# Patient Record
Sex: Female | Born: 1985 | Race: Black or African American | Hispanic: No | Marital: Married | Smoking: Never smoker
Health system: Southern US, Community
[De-identification: ages and names within clinical notes are randomized; demographics above are authoritative.]

## PROBLEM LIST (undated history)

## (undated) ENCOUNTER — Inpatient Hospital Stay (HOSPITAL_COMMUNITY): Payer: Self-pay

## (undated) DIAGNOSIS — Z789 Other specified health status: Secondary | ICD-10-CM

## (undated) DIAGNOSIS — O26892 Other specified pregnancy related conditions, second trimester: Secondary | ICD-10-CM

## (undated) DIAGNOSIS — N898 Other specified noninflammatory disorders of vagina: Secondary | ICD-10-CM

## (undated) DIAGNOSIS — O1205 Gestational edema, complicating the puerperium: Secondary | ICD-10-CM

## (undated) DIAGNOSIS — Z8759 Personal history of other complications of pregnancy, childbirth and the puerperium: Secondary | ICD-10-CM

## (undated) HISTORY — DX: Personal history of other complications of pregnancy, childbirth and the puerperium: Z87.59

## (undated) HISTORY — PX: NO PAST SURGERIES: SHX2092

## (undated) HISTORY — PX: WISDOM TOOTH EXTRACTION: SHX21

---

## 2007-03-31 ENCOUNTER — Inpatient Hospital Stay (HOSPITAL_COMMUNITY): Admission: AD | Admit: 2007-03-31 | Discharge: 2007-03-31 | Payer: Self-pay | Admitting: Gynecology

## 2007-06-16 ENCOUNTER — Other Ambulatory Visit: Payer: Self-pay | Admitting: Obstetrics and Gynecology

## 2007-06-17 ENCOUNTER — Emergency Department (HOSPITAL_COMMUNITY): Admission: EM | Admit: 2007-06-17 | Discharge: 2007-06-17 | Payer: Self-pay | Admitting: Emergency Medicine

## 2007-06-29 ENCOUNTER — Inpatient Hospital Stay (HOSPITAL_COMMUNITY): Admission: AD | Admit: 2007-06-29 | Discharge: 2007-06-29 | Payer: Self-pay | Admitting: Obstetrics & Gynecology

## 2007-10-13 ENCOUNTER — Inpatient Hospital Stay (HOSPITAL_COMMUNITY): Admission: AD | Admit: 2007-10-13 | Discharge: 2007-10-13 | Payer: Self-pay | Admitting: Obstetrics and Gynecology

## 2007-10-14 ENCOUNTER — Inpatient Hospital Stay (HOSPITAL_COMMUNITY): Admission: AD | Admit: 2007-10-14 | Discharge: 2007-10-14 | Payer: Self-pay | Admitting: Obstetrics and Gynecology

## 2007-10-18 ENCOUNTER — Inpatient Hospital Stay (HOSPITAL_COMMUNITY): Admission: AD | Admit: 2007-10-18 | Discharge: 2007-10-18 | Payer: Self-pay | Admitting: Obstetrics and Gynecology

## 2007-11-28 ENCOUNTER — Inpatient Hospital Stay (HOSPITAL_COMMUNITY): Admission: AD | Admit: 2007-11-28 | Discharge: 2007-11-30 | Payer: Self-pay | Admitting: Obstetrics & Gynecology

## 2009-07-28 ENCOUNTER — Emergency Department (HOSPITAL_COMMUNITY): Admission: EM | Admit: 2009-07-28 | Discharge: 2009-07-28 | Payer: Self-pay | Admitting: Emergency Medicine

## 2009-09-21 ENCOUNTER — Encounter: Admission: RE | Admit: 2009-09-21 | Discharge: 2009-11-22 | Payer: Self-pay | Admitting: Family Medicine

## 2010-08-06 LAB — URINALYSIS, ROUTINE W REFLEX MICROSCOPIC
Bilirubin Urine: NEGATIVE
Glucose, UA: NEGATIVE mg/dL
Hgb urine dipstick: NEGATIVE
Ketones, ur: NEGATIVE mg/dL
pH: 6.5 (ref 5.0–8.0)

## 2010-08-06 LAB — POCT I-STAT, CHEM 8
BUN: 13 mg/dL (ref 6–23)
Calcium, Ion: 1.11 mmol/L — ABNORMAL LOW (ref 1.12–1.32)
Creatinine, Ser: 0.8 mg/dL (ref 0.4–1.2)
HCT: 36 % (ref 36.0–46.0)

## 2010-08-06 LAB — POCT PREGNANCY, URINE: Preg Test, Ur: NEGATIVE

## 2011-02-01 LAB — COMPREHENSIVE METABOLIC PANEL
ALT: 22
AST: 26
Alkaline Phosphatase: 26 — ABNORMAL LOW
CO2: 23
Calcium: 9
Creatinine, Ser: 0.55
Glucose, Bld: 88
Total Bilirubin: 0.6

## 2011-02-01 LAB — DIFFERENTIAL
Basophils Absolute: 0
Eosinophils Absolute: 0.1
Eosinophils Absolute: 0
Eosinophils Relative: 0
Lymphocytes Relative: 26
Lymphocytes Relative: 5 — ABNORMAL LOW
Lymphs Abs: 2.1
Monocytes Absolute: 0.7
Monocytes Absolute: 0.4
Monocytes Relative: 6
Neutro Abs: 5.7
Neutrophils Relative %: 63

## 2011-02-01 LAB — URINE CULTURE

## 2011-02-01 LAB — CBC
HCT: 32.4 — ABNORMAL LOW
MCV: 83.1
Platelets: 302
RDW: 13.5
RDW: 14.4
WBC: 8.1

## 2011-02-01 LAB — CULTURE, BLOOD (ROUTINE X 2)

## 2011-02-07 LAB — FETAL FIBRONECTIN: Fetal Fibronectin: NEGATIVE

## 2011-02-07 LAB — URINALYSIS, ROUTINE W REFLEX MICROSCOPIC
Bilirubin Urine: NEGATIVE
Glucose, UA: NEGATIVE
Nitrite: NEGATIVE
Protein, ur: NEGATIVE
Urobilinogen, UA: 0.2

## 2011-02-08 LAB — CBC
HCT: 35 — ABNORMAL LOW
Hemoglobin: 10.4 — ABNORMAL LOW
Hemoglobin: 11.8 — ABNORMAL LOW
MCV: 86.7
Platelets: 191
RBC: 4.02
RDW: 13.8
WBC: 10.8 — ABNORMAL HIGH

## 2011-02-08 LAB — RPR: RPR Ser Ql: NONREACTIVE

## 2011-02-08 LAB — RH IMMUNE GLOB WKUP(>/=20WKS)(NOT WOMEN'S HOSP)

## 2011-02-19 LAB — CBC
Hemoglobin: 13.9
MCV: 82.1
Platelets: 332
RBC: 4.97
RDW: 13.1

## 2011-02-19 LAB — POCT PREGNANCY, URINE
Operator id: 266701
Preg Test, Ur: NEGATIVE

## 2011-02-19 LAB — URINALYSIS, ROUTINE W REFLEX MICROSCOPIC
Ketones, ur: 15 — AB
Leukocytes, UA: NEGATIVE
Specific Gravity, Urine: 1.02

## 2011-02-19 LAB — URINE MICROSCOPIC-ADD ON: WBC, UA: NONE SEEN

## 2011-10-03 ENCOUNTER — Ambulatory Visit: Payer: Self-pay | Admitting: Family Medicine

## 2011-11-13 ENCOUNTER — Ambulatory Visit: Payer: Self-pay | Admitting: Family

## 2012-10-14 ENCOUNTER — Encounter (HOSPITAL_BASED_OUTPATIENT_CLINIC_OR_DEPARTMENT_OTHER): Payer: Self-pay | Admitting: Emergency Medicine

## 2012-10-14 ENCOUNTER — Emergency Department (HOSPITAL_BASED_OUTPATIENT_CLINIC_OR_DEPARTMENT_OTHER)
Admission: EM | Admit: 2012-10-14 | Discharge: 2012-10-14 | Disposition: A | Discharge status: Home / Self Care | Payer: Managed Care, Other (non HMO) | Attending: Emergency Medicine | Admitting: Emergency Medicine

## 2012-10-14 DIAGNOSIS — R21 Rash and other nonspecific skin eruption: Secondary | ICD-10-CM | POA: Insufficient documentation

## 2012-10-14 DIAGNOSIS — R109 Unspecified abdominal pain: Secondary | ICD-10-CM | POA: Insufficient documentation

## 2012-10-14 DIAGNOSIS — R197 Diarrhea, unspecified: Secondary | ICD-10-CM | POA: Insufficient documentation

## 2012-10-14 DIAGNOSIS — R110 Nausea: Secondary | ICD-10-CM | POA: Insufficient documentation

## 2012-10-14 MED ORDER — ONDANSETRON 8 MG PO TBDP
8.0000 mg | ORAL_TABLET | Freq: Once | ORAL | Status: AC
  Administered 2012-10-14: 8 mg via ORAL
  Filled 2012-10-14: qty 1

## 2012-10-14 NOTE — ED Notes (Signed)
MD at bedside. 

## 2012-10-14 NOTE — ED Notes (Signed)
Pt reports diarrhea, abd pain and rash today. Pt reports similar episode x 1 week ago

## 2012-10-14 NOTE — ED Provider Notes (Signed)
History  This chart was scribed for Arhum Peeples Smitty Cords, MD by Bennett Scrape, ED Scribe. This patient was seen in room MH09/MH09 and the patient's care was started at 11:04 PM.  CSN: [id number]  Arrival date & time 10/14/12  2219   First MD Initiated Contact with Patient 10/14/12 2304      Chief Complaint  Patient presents with  . Diarrhea  . Rash     Patient is a 27 y.o. female presenting with diarrhea. The history is provided by the patient. No language interpreter was used.  Diarrhea Quality:  Semi-solid Severity:  Mild Number of episodes:  1 Timing:  Rare Progression:  Unchanged Relieved by:  Nothing Worsened by:  Nothing tried Ineffective treatments:  None tried Associated symptoms: no chills, no fever and no vomiting   Risk factors: no recent antibiotic use and no sick contacts     HPI Comments: Nasreen B Gaynell Face is a 27 y.o. female who presents to the Emergency Department complaining of one episode of diarrhea described as loose with associated cramps and diffuse rash that started earlier today. She reports that she had two similar episodes one week ago and two weeks ago. She reports that she ate steak and greens, Mindi Slicker and steak before each episode respectively.  She denies CP, SOB and fevers as associated symptoms. She reports that she just ended her LNMP. Pt does not have a h/o chronic medical conditions and is an occasional alcohol user but denies smoking.  History reviewed. No pertinent past medical history.  History reviewed. No pertinent past surgical history.  No family history on file.  History  Substance Use Topics  . Smoking status: Never Smoker   . Smokeless tobacco: Not on file  . Alcohol Use: Yes    No OB history provided.  Review of Systems  Constitutional: Negative for fever and chills.  Gastrointestinal: Positive for nausea and diarrhea. Negative for vomiting and blood in stool.  Skin: Positive for rash.  All other systems  reviewed and are negative.    Allergies  Sulfa antibiotics  Home Medications   Current Outpatient Rx  Name  Route  Sig  Dispense  Refill  . HYDROcodone-acetaminophen (NORCO/VICODIN) 5-325 MG per tablet   Oral   Take 1 tablet by mouth every 6 (six) hours as needed for pain.           Triage Vitals: BP 133/85  Pulse 70  Temp(Src) 98.4 F (36.9 C) (Oral)  Resp 18  Ht 5\' 6"  (1.676 m)  Wt 150 lb (68.04 kg)  BMI 24.22 kg/m2  SpO2 98%  Physical Exam  Nursing note and vitals reviewed. Constitutional: She is oriented to person, place, and time. She appears well-developed and well-nourished. No distress.  HENT:  Head: Normocephalic and atraumatic.  Mouth/Throat: Oropharynx is clear and moist.  Moist MM, no oral lesions  Eyes: Conjunctivae and EOM are normal. Pupils are equal, round, and reactive to light.  Normal fundi  Neck: Neck supple. No tracheal deviation present.  Cardiovascular: Normal rate, regular rhythm, normal heart sounds and intact distal pulses.   Pulmonary/Chest: Effort normal and breath sounds normal. No respiratory distress. She has no wheezes.  Abdominal: Soft. Bowel sounds are normal. She exhibits no distension and no mass. There is no tenderness. There is no rebound and no guarding.  Musculoskeletal: Normal range of motion. She exhibits no edema.  No lesions on the lower extremities   Neurological: She is alert and oriented to person, place, and time.  She displays normal reflexes.  Skin: Skin is warm and dry.  No lesions  Psychiatric: She has a normal mood and affect. Her behavior is normal.    ED Course  Procedures (including critical care time)  DIAGNOSTIC STUDIES: Oxygen Saturation is 98% on room air, normal by my interpretation.    COORDINATION OF CARE: 11:13 PM-Advised pt to start keeping a food diary to determine where the symptoms are coming from. Discussed treatment plan which includes nausea and diet instructions with pt at bedside and pt  agreed to plan. Informed pt to try pasta, bread and activa to help improve her symptoms.  Labs Reviewed - No data to display No results found.   No diagnosis found.    MDM  No rash visible no stool nor emesis in the ED exam and vitals benign, no indication for labs or imaging at this time.  Follow up with your family doctor for ongoing care    I personally performed the services described in this documentation, which was scribed in my presence. The recorded information has been reviewed and is accurate.     Jasmine Awe, MD 10/15/12 [phone]

## 2013-04-09 ENCOUNTER — Telehealth: Payer: Self-pay

## 2013-04-09 ENCOUNTER — Ambulatory Visit (INDEPENDENT_AMBULATORY_CARE_PROVIDER_SITE_OTHER): Payer: Managed Care, Other (non HMO) | Admitting: Family Medicine

## 2013-04-09 VITALS — BP 116/80 | HR 72 | Temp 98.8°F | Resp 16 | Ht 66.0 in | Wt 149.0 lb

## 2013-04-09 DIAGNOSIS — R112 Nausea with vomiting, unspecified: Secondary | ICD-10-CM

## 2013-04-09 DIAGNOSIS — L509 Urticaria, unspecified: Secondary | ICD-10-CM

## 2013-04-09 MED ORDER — RANITIDINE HCL 150 MG PO TABS: 150.0000 mg | ORAL_TABLET | Freq: Two times a day (BID) | ORAL | Status: DC

## 2013-04-09 MED ORDER — METHYLPREDNISOLONE ACETATE 80 MG/ML IJ SUSP
80.0000 mg | Freq: Once | INTRAMUSCULAR | Status: AC
  Administered 2013-04-09: 80 mg via INTRAMUSCULAR

## 2013-04-09 MED ORDER — PROMETHAZINE HCL 25 MG PO TABS: 25.0000 mg | ORAL_TABLET | Freq: Four times a day (QID) | ORAL | Status: DC | PRN

## 2013-04-09 NOTE — Progress Notes (Signed)
Subjective: 27 year old lady who has had several recent episodes of again has and vomiting. Has not been able to figure out any etiology. This morning she got up then broke out in hives vomited a couple times and came in here. She is not any breathing problems.  The has a party subsided  Objective: No rash visible. No oral lesions. Chest is clear. Heart regular without any murmurs.  Assessment: Recurrent urticaria Vomiting  Per instructions

## 2013-04-09 NOTE — Telephone Encounter (Signed)
Patient states that she is pain and wants a medication called in to help with it. [facility]   [phone]

## 2013-04-09 NOTE — Telephone Encounter (Signed)
Noted  

## 2013-04-09 NOTE — Patient Instructions (Signed)
Take Zyrtec one daily for antihistamine  Take Zantac one twice daily  ranitidine) for hives  Return if in all worse  Referral is being made to the allergist.

## 2013-04-09 NOTE — Telephone Encounter (Signed)
If she is painful, she should come back in, this is new. She is complaining of abdominal pain, have discussed with Dr Alwyn Ren, called patient. She should come back in. She states she will try to come back in now, but she is very painful. Advised her if she can not make it back in here, she should go to the emergency room, she understands/ agrees.

## 2013-08-16 ENCOUNTER — Other Ambulatory Visit: Payer: Self-pay | Admitting: Family Medicine

## 2013-08-16 ENCOUNTER — Ambulatory Visit (INDEPENDENT_AMBULATORY_CARE_PROVIDER_SITE_OTHER): Payer: Managed Care, Other (non HMO) | Admitting: Emergency Medicine

## 2013-08-16 VITALS — BP 110/60 | HR 60 | Temp 98.0°F | Resp 16 | Ht 65.75 in | Wt 143.0 lb

## 2013-08-16 DIAGNOSIS — R1013 Epigastric pain: Secondary | ICD-10-CM

## 2013-08-16 DIAGNOSIS — R110 Nausea: Secondary | ICD-10-CM

## 2013-08-16 LAB — POCT CBC
Granulocyte percent: 71.5 %G (ref 37–80)
HCT, POC: 40.7 % (ref 37.7–47.9)
HEMOGLOBIN: 12.8 g/dL (ref 12.2–16.2)
Lymph, poc: 2.1 (ref 0.6–3.4)
MCH: 26.8 pg — AB (ref 27–31.2)
MCHC: 31.4 g/dL — AB (ref 31.8–35.4)
MCV: 85.1 fL (ref 80–97)
MID (cbc): 0.5 (ref 0–0.9)
MPV: 8.9 fL (ref 0–99.8)
POC GRANULOCYTE: 6.4 (ref 2–6.9)
POC LYMPH PERCENT: 23 %L (ref 10–50)
POC MID %: 5.5 % (ref 0–12)
Platelet Count, POC: 397 10*3/uL (ref 142–424)
RBC: 4.78 M/uL (ref 4.04–5.48)
RDW, POC: 14.1 %
WBC: 9 10*3/uL (ref 4.6–10.2)

## 2013-08-16 MED ORDER — SUCRALFATE 1 G PO TABS: ORAL_TABLET | ORAL | Status: DC

## 2013-08-16 MED ORDER — LANSOPRAZOLE 30 MG PO CPDR: 30.0000 mg | DELAYED_RELEASE_CAPSULE | Freq: Every day | ORAL | Status: DC

## 2013-08-16 NOTE — Patient Instructions (Signed)

## 2013-08-16 NOTE — Progress Notes (Signed)
[facility] [address], [place] [address] [id number]  Date:  08/16/2013   Name:  Jessica Quinn   DOB:  May 14, 1985   MRN:  [id number]  PCP:  No PCP Per Patient    Chief Complaint: Abdominal Pain   History of Present Illness:  Jessica Quinn is a 28 y.o. very pleasant female patient who presents with the following:  Ill with a week of epigastric pain. Says nauseated intermittently but only vomited once.  Thinks it may be related to "eating my mom's holiday food".  No excess alcohol or NSAID or ASA.  No history of PUD, GERD or GB.  No vomiting blood or black stools or blood in stool.  Pain does not radiate.  No improvement with over the counter medications or other home remedies. Denies other complaint or health concern today.   Patient Active Problem List   Diagnosis Date Noted  . Hives 04/09/2013    No past medical history on file.  No past surgical history on file.  History  Substance Use Topics  . Smoking status: Never Smoker   . Smokeless tobacco: Not on file  . Alcohol Use: Yes    No family history on file.  Allergies  Allergen Reactions  . Sulfa Antibiotics Rash    Medication list has been reviewed and updated.  Current Outpatient Prescriptions on File Prior to Visit  Medication Sig Dispense Refill  . ranitidine (ZANTAC) 150 MG tablet Take 1 tablet (150 mg total) by mouth 2 (two) times daily.  60 tablet  0  . HYDROcodone-acetaminophen (NORCO/VICODIN) 5-325 MG per tablet Take 1 tablet by mouth every 6 (six) hours as needed for pain.      . promethazine (PHENERGAN) 25 MG tablet Take 1 tablet (25 mg total) by mouth every 6 (six) hours as needed for nausea or vomiting.  20 tablet  0   No current facility-administered medications on file prior to visit.    Review of Systems:  As per HPI, otherwise negative.    Physical Examination: Filed Vitals:   08/16/13 1257  BP: 110/60  Pulse: 60  Temp: 98 F (36.7 C)  Resp: 16    Filed Vitals:   08/16/13 1257  Height: 5' 5.75" (1.67 m)  Weight: 143 lb (64.864 kg)   Body mass index is 23.26 kg/(m^2). Ideal Body Weight: Weight in (lb) to have BMI = 25: 153.4  GEN: WDWN, NAD, Non-toxic, A & O x 3 HEENT: Atraumatic, Normocephalic. Neck supple. No masses, No LAD. Ears and Nose: No external deformity. CV: RRR, No M/G/R. No JVD. No thrill. No extra heart sounds. PULM: CTA B, no wheezes, crackles, rhonchi. No retractions. No resp. distress. No accessory muscle use. ABD: S, tender epigastrium, ND, +BS. No rebound. No HSM. EXTR: No c/c/e NEURO Normal gait.  PSYCH: Normally interactive. Conversant. Not depressed or anxious appearing.  Calm demeanor.    Assessment and Plan: Abdominal pain ?gastritis vs PUD vs gallbladder. Labs  Prevacid sono GB if labs negative NM study if sono negative Follow up in a week   Signed,  Phillips Odor, MD   Results for orders placed in visit on 08/16/13  POCT CBC      Result Value Ref Range   WBC 9.0  4.6 - 10.2 K/uL   Lymph, poc 2.1  0.6 - 3.4   POC LYMPH PERCENT 23.0  10 - 50 %L   MID (cbc) 0.5  0 - 0.9   POC  MID % 5.5  0 - 12 %M   POC Granulocyte 6.4  2 - 6.9   Granulocyte percent 71.5  37 - 80 %G   RBC 4.78  4.04 - 5.48 M/uL   Hemoglobin 12.8  12.2 - 16.2 g/dL   HCT, POC 30.840.7  65.737.7 - 47.9 %   MCV 85.1  80 - 97 fL   MCH, POC 26.8 (*) 27 - 31.2 pg   MCHC 31.4 (*) 31.8 - 35.4 g/dL   RDW, POC 84.614.1     Platelet Count, POC 397  142 - 424 K/uL   MPV 8.9  0 - 99.8 fL

## 2013-08-17 LAB — COMPREHENSIVE METABOLIC PANEL
ALT: 9 U/L (ref 0–35)
AST: 17 U/L (ref 0–37)
Albumin: 4.8 g/dL (ref 3.5–5.2)
Alkaline Phosphatase: 38 U/L — ABNORMAL LOW (ref 39–117)
BUN: 12 mg/dL (ref 6–23)
CALCIUM: 9.6 mg/dL (ref 8.4–10.5)
CHLORIDE: 100 meq/L (ref 96–112)
CO2: 24 mEq/L (ref 19–32)
Creat: 0.71 mg/dL (ref 0.50–1.10)
Glucose, Bld: 74 mg/dL (ref 70–99)
POTASSIUM: 4.2 meq/L (ref 3.5–5.3)
Sodium: 136 mEq/L (ref 135–145)
TOTAL PROTEIN: 8 g/dL (ref 6.0–8.3)
Total Bilirubin: 0.7 mg/dL (ref 0.2–1.2)

## 2013-08-17 LAB — LIPASE: LIPASE: 16 U/L (ref 0–75)

## 2013-08-17 LAB — AMYLASE: Amylase: 48 U/L (ref 0–105)

## 2013-08-17 LAB — H. PYLORI ANTIBODY, IGG: H Pylori IgG: 0.47 {ISR}

## 2013-08-18 ENCOUNTER — Telehealth: Payer: Self-pay

## 2013-08-18 ENCOUNTER — Other Ambulatory Visit: Payer: Self-pay | Admitting: Emergency Medicine

## 2013-08-18 DIAGNOSIS — R1013 Epigastric pain: Secondary | ICD-10-CM

## 2013-08-18 NOTE — Telephone Encounter (Signed)
Patient called stated doctor gave her special instructions after lab results. Patient stated she is not feeling better and she do not have an appetite. Patient request for someone to call her at work [phone]

## 2013-08-18 NOTE — Telephone Encounter (Signed)
Pt.notified

## 2013-08-18 NOTE — Telephone Encounter (Signed)
Please let her know it was ordered

## 2013-08-18 NOTE — Telephone Encounter (Signed)
Pt calling about her lab results.

## 2013-08-18 NOTE — Telephone Encounter (Signed)
Dr  Dareen PianoAnderson, you saw pt 08/16/13 for nausea. Do you want to RF phenergan for pt?

## 2013-08-18 NOTE — Telephone Encounter (Signed)
Spoke with pt. Pt is not feeling much better. Looks like if she wasn't an her labs were normal, you were going to refer her for a GB U/S. Pt is good with this plan. Do you want me to place order?

## 2013-09-14 ENCOUNTER — Ambulatory Visit
Admission: RE | Admit: 2013-09-14 | Discharge: 2013-09-14 | Disposition: A | Discharge status: Home / Self Care | Payer: Managed Care, Other (non HMO) | Source: Ambulatory Visit | Attending: Emergency Medicine | Admitting: Emergency Medicine

## 2013-09-14 DIAGNOSIS — R1013 Epigastric pain: Secondary | ICD-10-CM

## 2013-09-17 ENCOUNTER — Other Ambulatory Visit: Payer: Self-pay | Admitting: Emergency Medicine

## 2013-09-17 DIAGNOSIS — R1011 Right upper quadrant pain: Secondary | ICD-10-CM

## 2013-09-20 ENCOUNTER — Telehealth: Payer: Self-pay

## 2013-09-20 NOTE — Telephone Encounter (Signed)
Patient is calling for results of Abdominal US.  Please advise.

## 2013-09-20 NOTE — Telephone Encounter (Signed)
PT STATES SOMEONE CALLED REGARDING AN APPT AND SHE NEED TO HAVE MORE INFORMATION SINCE SHE DOESN'T KNOW WHAT IT IS ABOUT PLEASE CALL [phone]

## 2013-10-06 ENCOUNTER — Ambulatory Visit (HOSPITAL_COMMUNITY): Admission: RE | Admit: 2013-10-06 | Payer: Managed Care, Other (non HMO) | Source: Ambulatory Visit

## 2014-04-12 LAB — OB RESULTS CONSOLE ABO/RH: RH Type: NEGATIVE

## 2014-04-12 LAB — OB RESULTS CONSOLE RUBELLA ANTIBODY, IGM: RUBELLA: IMMUNE

## 2014-04-12 LAB — OB RESULTS CONSOLE HIV ANTIBODY (ROUTINE TESTING): HIV: NONREACTIVE

## 2014-04-12 LAB — OB RESULTS CONSOLE RPR: RPR: NONREACTIVE

## 2014-04-12 LAB — OB RESULTS CONSOLE ANTIBODY SCREEN: Antibody Screen: NEGATIVE

## 2014-04-12 LAB — OB RESULTS CONSOLE HEPATITIS B SURFACE ANTIGEN: Hepatitis B Surface Ag: NEGATIVE

## 2014-04-19 LAB — OB RESULTS CONSOLE GC/CHLAMYDIA
CHLAMYDIA, DNA PROBE: NEGATIVE
Gonorrhea: NEGATIVE

## 2014-05-13 NOTE — L&D Delivery Note (Signed)
Delivery Note At 10:07 AM a viable and healthy female was delivered via Vaginal, Spontaneous Delivery (Presentation: LOA ).  APGAR: 9, 9; weight pending .   Placenta status: spontaneous, intact.  Cord: 3 vessels with the following complications: None.  Cord pH: na  Anesthesia: Epidural  Episiotomy:  none Lacerations:  none Suture Repair: na Est. Blood Loss (mL):  100  Mom to postpartum.  Baby to Couplet care / Skin to Skin.  Chong January J 10/19/2014, 10:21 AM

## 2014-08-06 ENCOUNTER — Inpatient Hospital Stay (HOSPITAL_COMMUNITY)
Admission: AD | Admit: 2014-08-06 | Discharge: 2014-08-06 | Disposition: A | Discharge status: Home / Self Care | Payer: Managed Care, Other (non HMO) | Source: Ambulatory Visit | Attending: Obstetrics and Gynecology | Admitting: Obstetrics and Gynecology

## 2014-08-06 ENCOUNTER — Encounter (HOSPITAL_COMMUNITY): Payer: Self-pay | Admitting: *Deleted

## 2014-08-06 DIAGNOSIS — O9989 Other specified diseases and conditions complicating pregnancy, childbirth and the puerperium: Secondary | ICD-10-CM | POA: Diagnosis not present

## 2014-08-06 DIAGNOSIS — Z3A27 27 weeks gestation of pregnancy: Secondary | ICD-10-CM | POA: Diagnosis not present

## 2014-08-06 DIAGNOSIS — N390 Urinary tract infection, site not specified: Secondary | ICD-10-CM | POA: Diagnosis not present

## 2014-08-06 DIAGNOSIS — O26892 Other specified pregnancy related conditions, second trimester: Secondary | ICD-10-CM

## 2014-08-06 DIAGNOSIS — N898 Other specified noninflammatory disorders of vagina: Secondary | ICD-10-CM | POA: Diagnosis present

## 2014-08-06 HISTORY — DX: Other specified pregnancy related conditions, second trimester: N89.8

## 2014-08-06 HISTORY — DX: Other specified noninflammatory disorders of vagina: N89.8

## 2014-08-06 HISTORY — DX: Other specified health status: Z78.9

## 2014-08-06 HISTORY — DX: Other specified pregnancy related conditions, second trimester: O26.892

## 2014-08-06 LAB — AMNISURE RUPTURE OF MEMBRANE (ROM) NOT AT ARMC: AMNISURE: NEGATIVE

## 2014-08-06 NOTE — Discharge Instructions (Signed)

## 2014-08-06 NOTE — MAU Note (Signed)
Pt said she feels like she has been leaking as of a few hours ago.  Says she does not feel it coming out she just noticed her underwear were wet.  Denies cramping/VB.

## 2014-08-06 NOTE — MAU Provider Note (Signed)
History     CSN: [id number]  Arrival date and time: 08/06/14 1416  Provider notified: 1451 Provider on unit: 1550 Provider bedside: 1600      Chief Complaint  Patient presents with  . Vaginal Discharge   HPI  Ms. Jessica Quinn is a 29 yo G3P0111 at 27.[redacted] wks gestation presenting with complaints of leaking of fluid since ~1100.  She reports she was at work, went to the bathroom and her underwear has continued to stay wet.  She denies contractions or VB.  She reports (+) FM all day.  Her prenatal history is significant for (+) GBS bacteriuria.  Her primary OB provider is Dr. Juliene Pina at [facility].  Past Medical History  Diagnosis Date  . Medical history non-contributory   . Vaginal discharge during pregnancy in second trimester 08/06/2014    Past Surgical History  Procedure Laterality Date  . No past surgeries      History reviewed. No pertinent family history.  History  Substance Use Topics  . Smoking status: Never Smoker   . Smokeless tobacco: Not on file  . Alcohol Use: No    Allergies:  Allergies  Allergen Reactions  . Sulfa Antibiotics Rash    Prescriptions prior to admission  Medication Sig Dispense Refill Last Dose  . Prenatal Vit-Fe Fumarate-FA (PRENATAL MULTIVITAMIN) TABS tablet Take 1 tablet by mouth daily.   08/05/2014 at Unknown time  . ranitidine (ZANTAC) 150 MG tablet Take 1 tablet (150 mg total) by mouth 2 (two) times daily. 60 tablet 0 08/05/2014 at Unknown time  . lansoprazole (PREVACID) 30 MG capsule Take 1 capsule (30 mg total) by mouth daily at 12 noon. (Patient not taking: Reported on 08/06/2014) 30 capsule 5   . promethazine (PHENERGAN) 25 MG tablet TAKE 1 TABLET BY MOUTH EVERY 6 HOURS AS NEEDED FOR NAUSEA OR VOMITING (Patient not taking: Reported on 08/06/2014) 20 tablet 0   . sucralfate (CARAFATE) 1 G tablet 1 tab 1 hr ac and hs (Patient not taking: Reported on 08/06/2014) 120 tablet 0     Review of Systems  Constitutional: Negative.   HENT: Negative.    Eyes: Negative.   Respiratory: Negative.   Cardiovascular: Negative.   Gastrointestinal: Negative.   Genitourinary: Negative.   Musculoskeletal: Negative.   Skin: Negative.   Neurological: Negative.   Endo/Heme/Allergies: Negative.   Psychiatric/Behavioral: Negative.    CEFM FHR: 155 / moderate / accels present / decels absent TOCO: none  Results for orders placed or performed during the hospital encounter of 08/06/14 (from the past 24 hour(s))  Amnisure rupture of membrane (rom)     Status: None   Collection Time: 08/06/14  3:00 PM  Result Value Ref Range   Amnisure ROM NEGATIVE    Physical Exam   Blood pressure 116/64, pulse 99, temperature 98.2 F (36.8 C), temperature source Oral, resp. rate 18, last menstrual period 08/09/2013.  Physical Exam  Constitutional: She is oriented to person, place, and time. She appears well-developed and well-nourished.  HENT:  Head: Normocephalic and atraumatic.  Eyes: Pupils are equal, round, and reactive to light.  Neck: Normal range of motion.  Cardiovascular: Normal rate, regular rhythm and normal heart sounds.   Respiratory: Effort normal and breath sounds normal.  GI: Soft. Bowel sounds are normal.  Genitourinary:  Gravid uterus; S=D; SSE: Scant amount of thin, white vaginal discharge - no foul odor  Musculoskeletal: Normal range of motion.  Neurological: She is alert and oriented to person, place, and time. She has  normal reflexes.  Skin: Skin is warm and dry.  Psychiatric: She has a normal mood and affect. Her behavior is normal. Judgment and thought content normal.    MAU Course  Procedures NST [place] Amnisure Assessment and Plan  27.1 wks singleton IUP Vaginal Discharge (+) GBS bacteriuria  Discharge Home Keep scheduled appointment on 4/5 Call the office for any further questions, problems or concerns  Kenard GowerDAWSON, Ryane Canavan, M MSN, CNM 08/06/2014, 4:04 PM

## 2014-10-18 ENCOUNTER — Inpatient Hospital Stay (HOSPITAL_COMMUNITY)
Admission: AD | Admit: 2014-10-18 | Discharge: 2014-10-21 | DRG: 774 | Disposition: A | Discharge status: Home / Self Care | Payer: Managed Care, Other (non HMO) | Source: Ambulatory Visit | Attending: Obstetrics and Gynecology | Admitting: Obstetrics and Gynecology

## 2014-10-18 DIAGNOSIS — R609 Edema, unspecified: Secondary | ICD-10-CM | POA: Diagnosis not present

## 2014-10-18 DIAGNOSIS — O1205 Gestational edema, complicating the puerperium: Secondary | ICD-10-CM | POA: Clinically undetermined

## 2014-10-18 DIAGNOSIS — O99824 Streptococcus B carrier state complicating childbirth: Secondary | ICD-10-CM | POA: Diagnosis present

## 2014-10-18 DIAGNOSIS — O4103X0 Oligohydramnios, third trimester, not applicable or unspecified: Principal | ICD-10-CM | POA: Diagnosis present

## 2014-10-18 DIAGNOSIS — Z3A37 37 weeks gestation of pregnancy: Secondary | ICD-10-CM | POA: Diagnosis present

## 2014-10-18 DIAGNOSIS — O9089 Other complications of the puerperium, not elsewhere classified: Secondary | ICD-10-CM | POA: Diagnosis not present

## 2014-10-18 HISTORY — DX: Gestational edema, complicating the puerperium: O12.05

## 2014-10-18 NOTE — MAU Note (Signed)
Pt c/o contractions since about 2130. Denies any bleeding, but reports some leaking of fluid for the last hour.  Reports positive fetal movement

## 2014-10-19 ENCOUNTER — Inpatient Hospital Stay (HOSPITAL_COMMUNITY): Payer: Managed Care, Other (non HMO) | Admitting: Anesthesiology

## 2014-10-19 ENCOUNTER — Encounter (HOSPITAL_COMMUNITY): Payer: Self-pay

## 2014-10-19 DIAGNOSIS — Z3A37 37 weeks gestation of pregnancy: Secondary | ICD-10-CM | POA: Diagnosis present

## 2014-10-19 DIAGNOSIS — R609 Edema, unspecified: Secondary | ICD-10-CM | POA: Diagnosis not present

## 2014-10-19 DIAGNOSIS — O99824 Streptococcus B carrier state complicating childbirth: Secondary | ICD-10-CM | POA: Diagnosis present

## 2014-10-19 DIAGNOSIS — O9089 Other complications of the puerperium, not elsewhere classified: Secondary | ICD-10-CM | POA: Diagnosis not present

## 2014-10-19 DIAGNOSIS — O4103X0 Oligohydramnios, third trimester, not applicable or unspecified: Secondary | ICD-10-CM | POA: Diagnosis present

## 2014-10-19 LAB — CBC
HCT: 32.8 % — ABNORMAL LOW (ref 36.0–46.0)
Hemoglobin: 11 g/dL — ABNORMAL LOW (ref 12.0–15.0)
MCH: 27.3 pg (ref 26.0–34.0)
MCHC: 33.5 g/dL (ref 30.0–36.0)
MCV: 81.4 fL (ref 78.0–100.0)
Platelets: 201 10*3/uL (ref 150–400)
RBC: 4.03 MIL/uL (ref 3.87–5.11)
RDW: 14.4 % (ref 11.5–15.5)
WBC: 8.4 10*3/uL (ref 4.0–10.5)

## 2014-10-19 LAB — RPR: RPR Ser Ql: NONREACTIVE

## 2014-10-19 MED ORDER — SODIUM CHLORIDE 0.9 % IV SOLN
2.0000 g | Freq: Once | INTRAVENOUS | Status: AC
  Administered 2014-10-19: 2 g via INTRAVENOUS
  Filled 2014-10-19: qty 2000

## 2014-10-19 MED ORDER — DIBUCAINE 1 % RE OINT
1.0000 | TOPICAL_OINTMENT | RECTAL | Status: DC | PRN
Start: 2014-10-19 — End: 2014-10-21
  Filled 2014-10-19: qty 28

## 2014-10-19 MED ORDER — LIDOCAINE HCL (PF) 1 % IJ SOLN
INTRAMUSCULAR | Status: DC | PRN
  Administered 2014-10-19: 10 mL

## 2014-10-19 MED ORDER — SENNOSIDES-DOCUSATE SODIUM 8.6-50 MG PO TABS
2.0000 | ORAL_TABLET | ORAL | Status: DC
  Administered 2014-10-19 – 2014-10-20 (×2): 2 via ORAL
  Filled 2014-10-19 (×2): qty 2

## 2014-10-19 MED ORDER — ONDANSETRON HCL 4 MG/2ML IJ SOLN: 4.0000 mg | Freq: Four times a day (QID) | INTRAMUSCULAR | Status: DC | PRN

## 2014-10-19 MED ORDER — FENTANYL 2.5 MCG/ML BUPIVACAINE 1/10 % EPIDURAL INFUSION (WH - ANES)
14.0000 mL/h | INTRAMUSCULAR | Status: DC | PRN
  Administered 2014-10-19: 14 mL/h via EPIDURAL
  Filled 2014-10-19: qty 125

## 2014-10-19 MED ORDER — OXYCODONE-ACETAMINOPHEN 5-325 MG PO TABS
1.0000 | ORAL_TABLET | ORAL | Status: DC | PRN
  Administered 2014-10-19 – 2014-10-20 (×5): 1 via ORAL
  Filled 2014-10-19 (×5): qty 1

## 2014-10-19 MED ORDER — METHYLERGONOVINE MALEATE 0.2 MG PO TABS: 0.2000 mg | ORAL_TABLET | ORAL | Status: DC | PRN

## 2014-10-19 MED ORDER — FENTANYL 2.5 MCG/ML BUPIVACAINE 1/10 % EPIDURAL INFUSION (WH - ANES)
INTRAMUSCULAR | Status: DC | PRN
  Administered 2014-10-19: 14 mL/h via EPIDURAL

## 2014-10-19 MED ORDER — LANOLIN HYDROUS EX OINT: TOPICAL_OINTMENT | CUTANEOUS | Status: DC | PRN

## 2014-10-19 MED ORDER — ACETAMINOPHEN 325 MG PO TABS: 650.0000 mg | ORAL_TABLET | ORAL | Status: DC | PRN

## 2014-10-19 MED ORDER — CITRIC ACID-SODIUM CITRATE 334-500 MG/5ML PO SOLN: 30.0000 mL | ORAL | Status: DC | PRN

## 2014-10-19 MED ORDER — TERBUTALINE SULFATE 1 MG/ML IJ SOLN: 0.2500 mg | Freq: Once | INTRAMUSCULAR | Status: DC | PRN

## 2014-10-19 MED ORDER — DIPHENHYDRAMINE HCL 50 MG/ML IJ SOLN: 12.5000 mg | INTRAMUSCULAR | Status: DC | PRN

## 2014-10-19 MED ORDER — OXYCODONE-ACETAMINOPHEN 5-325 MG PO TABS
1.0000 | ORAL_TABLET | ORAL | Status: DC | PRN
Start: 2014-10-19 — End: 2014-10-19

## 2014-10-19 MED ORDER — PHENYLEPHRINE 40 MCG/ML (10ML) SYRINGE FOR IV PUSH (FOR BLOOD PRESSURE SUPPORT)
80.0000 ug | PREFILLED_SYRINGE | INTRAVENOUS | Status: DC | PRN
  Filled 2014-10-19: qty 20

## 2014-10-19 MED ORDER — BENZOCAINE-MENTHOL 20-0.5 % EX AERO
1.0000 "application " | INHALATION_SPRAY | CUTANEOUS | Status: DC | PRN
  Administered 2014-10-19: 1 via TOPICAL
  Filled 2014-10-19 (×2): qty 56

## 2014-10-19 MED ORDER — WITCH HAZEL-GLYCERIN EX PADS
1.0000 "application " | MEDICATED_PAD | CUTANEOUS | Status: DC | PRN
  Administered 2014-10-19: 1 via TOPICAL

## 2014-10-19 MED ORDER — FENTANYL 2.5 MCG/ML BUPIVACAINE 1/10 % EPIDURAL INFUSION (WH - ANES): 14.0000 mL/h | INTRAMUSCULAR | Status: DC | PRN

## 2014-10-19 MED ORDER — SODIUM CHLORIDE 0.9 % IV SOLN
1.0000 g | Freq: Four times a day (QID) | INTRAVENOUS | Status: DC
  Administered 2014-10-19: 1 g via INTRAVENOUS
  Filled 2014-10-19: qty 1000

## 2014-10-19 MED ORDER — PRENATAL MULTIVITAMIN CH
1.0000 | ORAL_TABLET | Freq: Every day | ORAL | Status: DC
Start: 2014-10-19 — End: 2014-10-21
  Administered 2014-10-19 – 2014-10-21 (×3): 1 via ORAL
  Filled 2014-10-19 (×3): qty 1

## 2014-10-19 MED ORDER — ONDANSETRON HCL 4 MG PO TABS
4.0000 mg | ORAL_TABLET | ORAL | Status: DC | PRN
  Filled 2014-10-19: qty 1

## 2014-10-19 MED ORDER — EPHEDRINE 5 MG/ML INJ: 10.0000 mg | INTRAVENOUS | Status: DC | PRN

## 2014-10-19 MED ORDER — TETANUS-DIPHTH-ACELL PERTUSSIS 5-2.5-18.5 LF-MCG/0.5 IM SUSP: 0.5000 mL | Freq: Once | INTRAMUSCULAR | Status: DC

## 2014-10-19 MED ORDER — OXYCODONE-ACETAMINOPHEN 5-325 MG PO TABS: 2.0000 | ORAL_TABLET | ORAL | Status: DC | PRN

## 2014-10-19 MED ORDER — ZOLPIDEM TARTRATE 5 MG PO TABS: 5.0000 mg | ORAL_TABLET | Freq: Every evening | ORAL | Status: DC | PRN

## 2014-10-19 MED ORDER — METHYLERGONOVINE MALEATE 0.2 MG/ML IJ SOLN: 0.2000 mg | INTRAMUSCULAR | Status: DC | PRN

## 2014-10-19 MED ORDER — ONDANSETRON HCL 4 MG/2ML IJ SOLN: 4.0000 mg | INTRAMUSCULAR | Status: DC | PRN

## 2014-10-19 MED ORDER — OXYTOCIN 40 UNITS IN LACTATED RINGERS INFUSION - SIMPLE MED: 62.5000 mL/h | INTRAVENOUS | Status: DC

## 2014-10-19 MED ORDER — DIPHENHYDRAMINE HCL 25 MG PO CAPS: 25.0000 mg | ORAL_CAPSULE | Freq: Four times a day (QID) | ORAL | Status: DC | PRN

## 2014-10-19 MED ORDER — LACTATED RINGERS IV SOLN
INTRAVENOUS | Status: DC
  Administered 2014-10-19: 02:00:00 via INTRAVENOUS

## 2014-10-19 MED ORDER — OXYTOCIN BOLUS FROM INFUSION
500.0000 mL | INTRAVENOUS | Status: DC
  Administered 2014-10-19: 500 mL via INTRAVENOUS

## 2014-10-19 MED ORDER — SIMETHICONE 80 MG PO CHEW: 80.0000 mg | CHEWABLE_TABLET | ORAL | Status: DC | PRN

## 2014-10-19 MED ORDER — OXYTOCIN 40 UNITS IN LACTATED RINGERS INFUSION - SIMPLE MED
1.0000 m[IU]/min | INTRAVENOUS | Status: DC
  Administered 2014-10-19: 2 m[IU]/min via INTRAVENOUS
  Filled 2014-10-19 (×2): qty 1000

## 2014-10-19 MED ORDER — LIDOCAINE HCL (PF) 1 % IJ SOLN: 30.0000 mL | INTRAMUSCULAR | Status: DC | PRN

## 2014-10-19 MED ORDER — IBUPROFEN 600 MG PO TABS
600.0000 mg | ORAL_TABLET | Freq: Four times a day (QID) | ORAL | Status: DC
  Administered 2014-10-19 – 2014-10-21 (×9): 600 mg via ORAL
  Filled 2014-10-19 (×9): qty 1

## 2014-10-19 MED ORDER — LACTATED RINGERS IV SOLN: 500.0000 mL | INTRAVENOUS | Status: DC | PRN

## 2014-10-19 MED ORDER — TETANUS-DIPHTH-ACELL PERTUSSIS 5-2.5-18.5 LF-MCG/0.5 IM SUSP
0.5000 mL | Freq: Once | INTRAMUSCULAR | Status: AC
  Administered 2014-10-19: 0.5 mL via INTRAMUSCULAR

## 2014-10-19 MED ORDER — FLEET ENEMA 7-19 GM/118ML RE ENEM: 1.0000 | ENEMA | RECTAL | Status: DC | PRN

## 2014-10-19 NOTE — Anesthesia Preprocedure Evaluation (Addendum)
Anesthesia Evaluation  Patient identified by MRN, date of birth, ID band Patient awake and Patient confused    Reviewed: Allergy & Precautions, H&P , NPO status , Patient's Chart, lab work & pertinent test results, reviewed documented beta blocker date and time   Airway Mallampati: II  TM Distance: >3 FB Neck ROM: full    Dental no notable dental hx.    Pulmonary neg pulmonary ROS,  breath sounds clear to auscultation  Pulmonary exam normal       Cardiovascular Exercise Tolerance: Good negative cardio ROS Normal cardiovascular examRhythm:regular Rate:Normal     Neuro/Psych negative neurological ROS  negative psych ROS   GI/Hepatic negative GI ROS, Neg liver ROS,   Endo/Other  negative endocrine ROS  Renal/GU negative Renal ROS  negative genitourinary   Musculoskeletal   Abdominal   Peds  Hematology negative hematology ROS (+)   Anesthesia Other Findings   Reproductive/Obstetrics negative OB ROS (+) Pregnancy                             Anesthesia Physical Anesthesia Plan  ASA: II  Anesthesia Plan: General and Epidural   Post-op Pain Management:    Induction:   Airway Management Planned:   Additional Equipment:   Intra-op Plan:   Post-operative Plan:   Informed Consent: I have reviewed the patients History and Physical, chart, labs and discussed the procedure including the risks, benefits and alternatives for the proposed anesthesia with the patient or authorized representative who has indicated his/her understanding and acceptance.   Dental Advisory Given  Plan Discussed with: CRNA  Anesthesia Plan Comments:         Anesthesia Quick Evaluation

## 2014-10-19 NOTE — H&P (Signed)
Jessica Quinn is a 29 y.o. female presenting for labor.  Maternal Medical History:  Reason for admission: Contractions.   Contractions: Onset was 3-5 hours ago.   Frequency: regular.   Perceived severity is moderate.    Fetal activity: Perceived fetal activity is normal.   Last perceived fetal movement was within the past hour.    Prenatal complications: Oligohydramnios and preterm labor.   Prenatal Complications - Diabetes: none.    OB History    Gravida Para Term Preterm AB TAB SAB Ectopic Multiple Living   3 1  1 1 1    1      Past Medical History  Diagnosis Date  . Medical history non-contributory   . Vaginal discharge during pregnancy in second trimester 08/06/2014   Past Surgical History  Procedure Laterality Date  . No past surgeries    . Wisdom tooth extraction     Family History: family history is not on file. Social History:  reports that she has never smoked. She does not have any smokeless tobacco history on file. She reports that she does not drink alcohol or use illicit drugs.   Prenatal Transfer Tool  Maternal Diabetes: No Genetic Screening: Normal Maternal Ultrasounds/Referrals: Abnormal:  Findings:   Other: oligo Fetal Ultrasounds or other Referrals:  None Maternal Substance Abuse:  No Significant Maternal Medications:  Meds include: Progesterone Significant Maternal Lab Results:  Lab values include: Group B Strep positive Other Comments:  None  Review of Systems  Constitutional: Negative.   All other systems reviewed and are negative.   Dilation: 4.5 Effacement (%): 80 Station: -2 Exam by:: PharmacologistAmber Stovall RN Blood pressure 105/70, pulse 68, temperature 98.7 F (37.1 C), temperature source Oral, resp. rate 18, height 5\' 5"  (1.651 m), weight 95.709 kg (211 lb), last menstrual period 08/09/2013, SpO2 100 %. Maternal Exam:  Uterine Assessment: Contraction strength is mild.  Contraction frequency is irregular.   Abdomen: Patient reports no  abdominal tenderness. Fetal presentation: vertex  Introitus: Normal vulva. Normal vagina.  Ferning test: not done.  Nitrazine test: not done. Amniotic fluid character: not assessed.  Pelvis: adequate for delivery.   Cervix: Cervix evaluated by digital exam.     Physical Exam  Nursing note and vitals reviewed. Constitutional: She is oriented to person, place, and time. She appears well-developed and well-nourished.  HENT:  Head: Normocephalic and atraumatic.  Neck: Normal range of motion. Neck supple.  Cardiovascular: Normal rate and regular rhythm.   Respiratory: Effort normal and breath sounds normal.  GI: Soft. Bowel sounds are normal.  Genitourinary: Vagina normal and uterus normal.  Musculoskeletal: Normal range of motion.  Neurological: She is alert and oriented to person, place, and time. She has normal reflexes.  Skin: Skin is warm and dry.  Psychiatric: She has a normal mood and affect.    Prenatal labs: ABO, Rh: --/--/A NEG (06/08 0202) Antibody: POS (06/08 0202) Rubella: Immune (12/01 0000) RPR: Nonreactive (12/01 0000)  HBsAg: Negative (12/01 0000)  HIV: Non-reactive (12/01 0000)  GBS:     Assessment/Plan: Term IUP Active labor Admit   Jessica Quinn 10/19/2014, 6:26 AM

## 2014-10-19 NOTE — Anesthesia Postprocedure Evaluation (Signed)
  Anesthesia Post-op Note  Patient: Jessica Quinn  Procedure(s) Performed: * No procedures listed *  Patient Location: Mother/Baby  Anesthesia Type:Epidural  Level of Consciousness: awake  Airway and Oxygen Therapy: Patient Spontanous Breathing  Post-op Pain: mild  Post-op Assessment: Patient's Cardiovascular Status Stable and Respiratory Function Stable              Post-op Vital Signs: stable  Last Vitals:  Filed Vitals:   10/19/14 1145  BP: 113/61  Pulse: 67  Temp: 36.9 C  Resp:     Complications: No apparent anesthesia complications

## 2014-10-19 NOTE — Anesthesia Procedure Notes (Signed)
Epidural Patient location during procedure: OB Start time: 10/19/2014 3:20 AM End time: 10/19/2014 3:36 AM  Staffing Anesthesiologist: Sebastian AcheMANNY, Ezrah Dembeck  Preanesthetic Checklist Completed: patient identified, site marked, surgical consent, pre-op evaluation, timeout performed, IV checked, risks and benefits discussed and monitors and equipment checked  Epidural Patient position: sitting Prep: site prepped and draped and DuraPrep Patient monitoring: heart rate, continuous pulse ox and blood pressure Approach: midline Location: L2-L3 Injection technique: LOR air  Needle:  Needle type: Tuohy  Needle gauge: 17 G Needle length: 9 cm and 9 Needle insertion depth: 7 cm Catheter type: closed end flexible Catheter size: 19 Gauge Catheter at skin depth: 14 cm Test dose: negative  Assessment Events: blood not aspirated, injection not painful, no injection resistance, negative IV test and no paresthesia  Additional Notes   Patient tolerated the insertion well without complications.Reason for block:procedure for pain

## 2014-10-19 NOTE — Progress Notes (Signed)
Dovie Gaynell FaceMarshall is a 29 y.o. [id number] at 8035w5d by LMP admitted for active labor  Subjective: comfortable  Objective: BP 120/79 mmHg  Pulse 79  Temp(Src) 98.7 F (37.1 C) (Oral)  Resp 18  Ht 5\' 5"  (1.651 m)  Wt 95.709 kg (211 lb)  BMI 35.11 kg/m2  SpO2 100%  LMP 08/09/2013      FHT:  FHR: 155 bpm, variability: moderate,  accelerations:  Present,  decelerations:  Present early UC:   regular, every 3 minutes SVE:   Dilation: 6.5 Effacement (%): 70, 80 Station: -2 Exam by:: Dr Billy Coastaavon  AROM- clear  Labs: Lab Results  Component Value Date   WBC 8.4 10/19/2014   HGB 11.0* 10/19/2014   HCT 32.8* 10/19/2014   MCV 81.4 10/19/2014   PLT 201 10/19/2014    Assessment / Plan: Augmentation of labor, progressing well  Labor: Progressing normally Preeclampsia:  no signs or symptoms of toxicity Fetal Wellbeing:  Category I Pain Control:  Epidural I/D:  n/a Anticipated MOD:  NSVD  Kirbi Farrugia J 10/19/2014, 8:41 AM

## 2014-10-19 NOTE — Progress Notes (Signed)
Orders to admit to L&D, epidural PRN, Ampicillin per GBS protocol

## 2014-10-20 ENCOUNTER — Encounter (HOSPITAL_COMMUNITY): Payer: Self-pay | Admitting: Obstetrics and Gynecology

## 2014-10-20 DIAGNOSIS — O1205 Gestational edema, complicating the puerperium: Secondary | ICD-10-CM | POA: Clinically undetermined

## 2014-10-20 HISTORY — DX: Gestational edema, complicating the puerperium: O12.05

## 2014-10-20 LAB — CBC
HCT: 30.5 % — ABNORMAL LOW (ref 36.0–46.0)
Hemoglobin: 10.1 g/dL — ABNORMAL LOW (ref 12.0–15.0)
MCH: 27.2 pg (ref 26.0–34.0)
MCHC: 33.1 g/dL (ref 30.0–36.0)
MCV: 82 fL (ref 78.0–100.0)
Platelets: 180 10*3/uL (ref 150–400)
RBC: 3.72 MIL/uL — ABNORMAL LOW (ref 3.87–5.11)
RDW: 14.5 % (ref 11.5–15.5)
WBC: 8.1 10*3/uL (ref 4.0–10.5)

## 2014-10-20 MED ORDER — RHO D IMMUNE GLOBULIN 1500 UNIT/2ML IJ SOSY
300.0000 ug | PREFILLED_SYRINGE | Freq: Once | INTRAMUSCULAR | Status: AC
  Administered 2014-10-20: 300 ug via INTRAMUSCULAR
  Filled 2014-10-20: qty 2

## 2014-10-20 NOTE — Progress Notes (Signed)
Patient ID: Jeris Paladino, female   DOB: Nov 26, 1985, 29 y.o.   MRN: [id number] PPD # 1 SVD  S:  Reports feeling sore             Tolerating po/ No nausea or vomiting             Bleeding is light             Pain controlled with ibuprofen (OTC)             Up ad lib / ambulatory / voiding without difficulties    Newborn  Information for the patient's newborn:  Ronnette, Askeland [[id number]]  female  breast feeding  / Circumcision planning   O:  A & O x 3, in no apparent distress              VS:  Filed Vitals:   10/19/14 1240 10/19/14 1725 10/20/14 0500 10/20/14 0541  BP: 122/65 106/60 119/68 104/59  Pulse: 71 67 87 71  Temp: 97.8 F (36.6 C) 98.3 F (36.8 C) 98.1 F (36.7 C) 98 F (36.7 C)  TempSrc:   Axillary Oral  Resp: 18 20 18 18   Height:      Weight:      SpO2:   100%     LABS:  Recent Labs  10/19/14 0202 10/20/14 0630  WBC 8.4 8.1  HGB 11.0* 10.1*  HCT 32.8* 30.5*  PLT 201 180    Blood type: --/--/A NEG (06/08 0202) / Infant Rh POS - Rhophylac indicated  Rubella: Immune (12/01 0000)   I&O: I/O last 3 completed shifts: In: -  Out: 403 [Urine:250; Blood:153]             Lungs: Clear and unlabored  Heart: regular rate and rhythm / no murmurs  Abdomen: soft, non-tender, non-distended              Fundus: firm, non-tender, U-1  Perineum: intact   Lochia: scant  Extremities: 1+ edema, no calf pain or tenderness, no Homans    A/P: PPD # 1  29 y.o., [id number]   Principal Problem:    Postpartum care following vaginal delivery (6/8)  Active Problems:    Postpartum Edema   Doing well - stable status  Routine post partum orders  Increase water intake today  Elevate LE as much as possible today  Consider HCTZ at discharge home tomorrow  Anticipate discharge tomorrow    Raelyn Mora, M, MSN, CNM 10/20/2014, 9:00 AM

## 2014-10-20 NOTE — Lactation Note (Signed)
This note was copied from the chart of Jessica Edythe Gaynell Face. Lactation Consultation Note: Baby sleepy after circ earlier this afternoon. Mom reports he is latching well and often. Encouraged to feed whenever she sees feeding cues. Asking about pumping- questions answered- has Medela pump at home. No further questions at present. To call for assist prn  Patient Name: Jessica Quinn Date: 10/20/2014 Reason for consult: Follow-up assessment   Maternal Data Formula Feeding for Exclusion: No  Feeding    LATCH Score/Interventions Latch: Too sleepy or reluctant, no latch achieved, no sucking elicited.                    Lactation Tools Discussed/Used     Consult Status Consult Status: Follow-up Date: 10/21/14 Follow-up type: In-patient    Pamelia Hoit 10/20/2014, 2:54 PM

## 2014-10-20 NOTE — Lactation Note (Signed)
This note was copied from the chart of Jessica Cinda Gaynell Face. Lactation Consultation Note Mom's first child was a premiee and didn't BF. Mom has large breast w/good everted nipples. BF in cradle position. Hand expression taught w/good colostrum noted.  Discussed positions, supply and demand, STS, I&O. Mom encouraged to feed baby 8-12 times/24 hours and with feeding cues. Mom encouraged to waken baby for feeds. Referred to Baby and Me Book in Breastfeeding section Pg. 22-23 for position options and Proper latch demonstration.Mom reports + breast changes w/pregnancy. Educated about newborn behavior, being sleepy and cluster feeding. Encouraged comfort during BF so colostrum flows better and mom will enjoy the feeding longer. Taking deep breaths and breast massage during BF. WH/LC brochure given w/resources, support groups and LC services. Patient Name: Jessica Quinn [id number] Date: 10/20/2014 Reason for consult: Initial assessment   Maternal Data Has patient been taught Hand Expression?: Yes Does the patient have breastfeeding experience prior to this delivery?: No  Feeding Feeding Type: Breast Fed Length of feed: 10 min (still BF)  LATCH Score/Interventions Latch: Grasps breast easily, tongue down, lips flanged, rhythmical sucking. Intervention(s): Skin to skin;Teach feeding cues;Waking techniques  Audible Swallowing: A few with stimulation Intervention(s): Skin to skin;Hand expression Intervention(s): Hand expression;Alternate breast massage  Type of Nipple: Everted at rest and after stimulation  Comfort (Breast/Nipple): Soft / non-tender     Hold (Positioning): Assistance needed to correctly position infant at breast and maintain latch. Intervention(s): Breastfeeding basics reviewed;Support Pillows;Position options;Skin to skin  LATCH Score: 8  Lactation Tools Discussed/Used     Consult Status Consult Status: Follow-up Date: 10/21/14 Follow-up type:  In-patient    Shanaia Sievers, Diamond Nickel 10/20/2014, 6:45 AM

## 2014-10-21 LAB — RH IG WORKUP (INCLUDES ABO/RH)
ABO/RH(D): A NEG
Fetal Screen: NEGATIVE
GESTATIONAL AGE(WKS): 37
UNIT DIVISION: 0

## 2014-10-21 MED ORDER — IBUPROFEN 600 MG PO TABS: 600.0000 mg | ORAL_TABLET | Freq: Four times a day (QID) | ORAL | Status: DC

## 2014-10-21 MED ORDER — HYDROCHLOROTHIAZIDE 12.5 MG PO TABS: 12.5000 mg | ORAL_TABLET | Freq: Every day | ORAL | Status: DC

## 2014-10-21 NOTE — Lactation Note (Signed)
This note was copied from the chart of Jessica Quinn. Lactation Consultation Note  Patient Name: Jessica Haille Dingmann [id number] Date: 10/21/2014 Reason for consult: Follow-up assessment  Baby is 48 hours old , Early term infant, 7% weight loss. Today's weight - 5-15.1 oz  Breast feeding well range 10 -15 mins , 3x's 5-8 mins LScore - 8-9  Voids and stools adequate for age. Serum Bili - at 40 hours - 10.4, repeat  Serum during consult. Pending.  @ consult baby awake and rooting , LC assisted mom with positioning and cross  Cradle position. Baby latched with depth , multiply swallows, increased with breast  Compressions and per mom more comfortable. Nipples have been tender , no breakdown noted. Breast are warm and fulling. LC reviewed sore nipples and engorgement prevention and tx. Per mom has a DEBP at home [facility] ). Mother informed of post-discharge support and given phone number to the lactation department, including  services for phone call assistance; out-patient appointments; and breastfeeding support group. List of other  breastfeeding resources in the community given in the handout. Encouraged mother to call for problems or concerns related to breastfeeding.    Maternal Data Has patient been taught Hand Expression?: Yes  Feeding Feeding Type: Breast Fed Length of feed:  (multiply swallows noted , still feeding at 1029)  LATCH Score/Interventions Latch: Grasps breast easily, tongue down, lips flanged, rhythmical sucking. Intervention(s): Skin to skin;Teach feeding cues;Waking techniques  Audible Swallowing: Spontaneous and intermittent  Type of Nipple: Everted at rest and after stimulation  Comfort (Breast/Nipple): Filling, red/small blisters or bruises, mild/mod discomfort  Problem noted: Filling  Hold (Positioning): Assistance needed to correctly position infant at breast and maintain latch. Intervention(s): Breastfeeding basics reviewed;Support  Pillows;Position options;Skin to skin  LATCH Score: 8  Lactation Tools Discussed/Used     Consult Status Consult Status: Complete Date: 10/21/14    Kathrin Greathouse 10/21/2014, 10:28 AM

## 2014-10-21 NOTE — Progress Notes (Signed)
Patient ID: Jessica Quinn, female   DOB: June 07, 1985, 29 y.o.   MRN: [id number] PPD # 2 SVD  S:  Reports feeling better today - ready to go home             Tolerating po/ No nausea or vomiting             Bleeding is light             Pain controlled with ibuprofen (OTC)             Up ad lib / ambulatory / voiding without difficulties    Newborn  Information for the patient's newborn:  Benina, Lillard [[id number]]  female   breast feeding  / Circumcision done   O:  A & O x 3, in no apparent distress              VS:  Filed Vitals:   10/20/14 0500 10/20/14 0541 10/20/14 1820 10/21/14 0642  BP: 119/68 104/59 124/75 111/63  Pulse: 87 71 70 71  Temp: 98.1 F (36.7 C) 98 F (36.7 C) 97.9 F (36.6 C) 98.5 F (36.9 C)  TempSrc: Axillary Oral Oral Oral  Resp: 18 18 18 18   Height:      Weight:      SpO2: 100%       LABS:   Recent Labs  10/19/14 0202 10/20/14 0630  WBC 8.4 8.1  HGB 11.0* 10.1*  HCT 32.8* 30.5*  PLT 201 180    Blood type: A NEG (06/09 0630) / Infant Rh POS - Rhophylac done 6/9  Rubella: Immune (12/01 0000)     Lungs: Clear and unlabored  Heart: regular rate and rhythm / no murmurs  Abdomen: soft, non-tender, non-distended              Fundus: firm, non-tender, U-2  Perineum: intact   Lochia: scant  Extremities: 2+ edema, no calf pain or tenderness, no Homans    A/P: PPD # 2  28 y.o., [id number]   Principal Problem:    Postpartum care following vaginal delivery (6/8)  Active Problems:    Postpartum Edema   Doing well - stable status  Routine post partum orders  Increase water intake today  Elevate LE as much as possible today  Rx: HCTZ 12.5 mg at discharge today  D/C home today    Raelyn Mora, M, MSN, CNM 10/21/2014, 11:01 AM

## 2014-10-21 NOTE — Discharge Instructions (Signed)
Breast Pumping Tips If you are breastfeeding, there may be times when you cannot feed your baby directly. Returning to work or going on a trip are examples. Pumping allows you to store breast milk and feed it to your baby later.  You may not get much milk when you first start to pump. Your breasts should start to make more after a few days. If you pump at the times you usually feed your baby, you may be able to keep making enough milk to feed your baby without also using formula. The more often you pump, the more milk your body will make. WHEN SHOULD I PUMP?   You can start to pump soon after you have your baby. Ask your doctor what is right for you and your baby.  If you are going back to work, start pumping a few weeks before. This gives you time to learn how to pump and to store a supply of milk.  When you are with your baby, feed your baby when he or she is hungry. Pump after each feeding.  When you are away from your baby for many hours, pump for about 15 minutes every 2-3 hours. Pump both breasts at the same time if you can.  If your baby has a formula feeding, make sure to pump close to the same time.  If you drink any alcohol, wait 2 hours before pumping. HOW DO I GET READY TO PUMP? Your let-down reflex is your body's natural reaction that makes your breast milk flow. It is easier to make your breast milk flow when you are relaxed. Try these things to help you relax:  Smell one of your baby's blankets or an item of clothing.  Look at a picture or video of your baby.  Sit in a quiet, private space.  Massage the breast you plan to pump.  Place soothing warmth on the breast.  Play relaxing music. WHAT ARE SOME BREAST PUMPING TIPS?  Wash your hands before you pump. You do not need to wash your nipples or breasts.  There are three ways to pump. You can:  Use your hand to massage and squeeze your breast.  Use a handheld manual pump.  Use an electric pump.  Make sure the  suction cup on the breast pump is the right size. Place the suction cup directly over the nipple. It can be painful or hurt your nipple if it is the wrong size or placed wrong.  Put a small amount of purified or modified lanolin on your nipple and areola if you are sore.  If you are using an electric pump, change the speed and suction power to be more comfortable.  You may need a different type of pump if pumping hurts or you do not get a lot of milk. Your doctor can help you pick what type of pump to use.  Keep a full water bottle near you always. Drinking lots of fluid helps you make more milk.  You can store your milk to use later. Pumped breast milk can be stored in a sealable, sterile container or plastic bag. Always put the date you pumped it on the container.  Milk can stay out at room temperature for up to 8 hours.  You can store your milk in the refrigerator for up to 8 days.  You can store your milk in the freezer for 3 months. Thaw frozen milk using warm water. Do not put it in the microwave.  Do not smoke.  Ask your doctor for help. WHEN SHOULD I CALL MY DOCTOR?  You have a hard time pumping.  You are worried you do not make enough milk.  You have nipple pain, soreness, or redness.  You want to take birth control pills. Document Released: 10/16/2007 Document Revised: 05/04/2013 Document Reviewed: 02/19/2013 [facility] Patient Information 2015 [place], [place]. This information is not intended to replace advice given to you by your health care provider. Make sure you discuss any questions you have with your health care provider.  Peripheral Edema You have swelling in your legs (peripheral edema). This swelling is due to excess accumulation of salt and water in your body. Edema may be a sign of heart, kidney or liver disease, or a side effect of a medication. It may also be due to problems in the leg veins. Elevating your legs and using special support stockings may be very  helpful, if the cause of the swelling is due to poor venous circulation. Avoid long periods of standing, whatever the cause. Treatment of edema depends on identifying the cause. Chips, pretzels, pickles and other salty foods should be avoided. Restricting salt in your diet is almost always needed. Water pills (diuretics) are often used to remove the excess salt and water from your body via urine. These medicines prevent the kidney from reabsorbing sodium. This increases urine flow. Diuretic treatment may also result in lowering of potassium levels in your body. Potassium supplements may be needed if you have to use diuretics daily. Daily weights can help you keep track of your progress in clearing your edema. You should call your caregiver for follow up care as recommended. SEEK IMMEDIATE MEDICAL CARE IF:   You have increased swelling, pain, redness, or heat in your legs.  You develop shortness of breath, especially when lying down.  You develop chest or abdominal pain, weakness, or fainting.  You have a fever. Document Released: 06/06/2004 Document Revised: 07/22/2011 Document Reviewed: 05/17/2009 [facility] Patient Information 2015 [place], [place]. This information is not intended to replace advice given to you by your health care provider. Make sure you discuss any questions you have with your health care provider. Postpartum Depression and Baby Blues The postpartum period begins right after the birth of a baby. During this time, there is often a great amount of joy and excitement. It is also a time of many changes in the life of the parents. Regardless of how many times a mother gives birth, each child brings new challenges and dynamics to the family. It is not unusual to have feelings of excitement along with confusing shifts in moods, emotions, and thoughts. All mothers are at risk of developing postpartum depression or the "baby blues." These mood changes can occur right after giving birth, or  they may occur many months after giving birth. The baby blues or postpartum depression can be mild or severe. Additionally, postpartum depression can go away rather quickly, or it can be a long-term condition.  CAUSES Raised hormone levels and the rapid drop in those levels are thought to be a main cause of postpartum depression and the baby blues. A number of hormones change during and after pregnancy. Estrogen and progesterone usually decrease right after the delivery of your baby. The levels of thyroid hormone and various cortisol steroids also rapidly drop. Other factors that play a role in these mood changes include major life events and genetics.  RISK FACTORS If you have any of the following risks for the baby blues or postpartum depression,  know what symptoms to watch out for during the postpartum period. Risk factors that may increase the likelihood of getting the baby blues or postpartum depression include:  Having a personal or family history of depression.   Having depression while being pregnant.   Having premenstrual mood issues or mood issues related to oral contraceptives.  Having a lot of life stress.   Having marital conflict.   Lacking a social support network.   Having a baby with special needs.   Having health problems, such as diabetes.  SIGNS AND SYMPTOMS Symptoms of baby blues include:  Brief changes in mood, such as going from extreme happiness to sadness.  Decreased concentration.   Difficulty sleeping.   Crying spells, tearfulness.   Irritability.   Anxiety.  Symptoms of postpartum depression typically begin within the first month after giving birth. These symptoms include:  Difficulty sleeping or excessive sleepiness.   Marked weight loss.   Agitation.   Feelings of worthlessness.   Lack of interest in activity or food.  Postpartum psychosis is a very serious condition and can be dangerous. Fortunately, it is rare. Displaying  any of the following symptoms is cause for immediate medical attention. Symptoms of postpartum psychosis include:   Hallucinations and delusions.   Bizarre or disorganized behavior.   Confusion or disorientation.  DIAGNOSIS  A diagnosis is made by an evaluation of your symptoms. There are no medical or lab tests that lead to a diagnosis, but there are various questionnaires that a health care provider may use to identify those with the baby blues, postpartum depression, or psychosis. Often, a screening tool called the [place] Postnatal Depression Scale is used to diagnose depression in the postpartum period.  TREATMENT The baby blues usually goes away on its own in 1-2 weeks. Social support is often all that is needed. You will be encouraged to get adequate sleep and rest. Occasionally, you may be given medicines to help you sleep.  Postpartum depression requires treatment because it can last several months or longer if it is not treated. Treatment may include individual or group therapy, medicine, or both to address any social, physiological, and psychological factors that may play a role in the depression. Regular exercise, a healthy diet, rest, and social support may also be strongly recommended.  Postpartum psychosis is more serious and needs treatment right away. Hospitalization is often needed. HOME CARE INSTRUCTIONS  Get as much rest as you can. Nap when the baby sleeps.   Exercise regularly. Some women find yoga and walking to be beneficial.   Eat a balanced and nourishing diet.   Do little things that you enjoy. Have a cup of tea, take a bubble bath, read your favorite magazine, or listen to your favorite music.  Avoid alcohol.   Ask for help with household chores, cooking, grocery shopping, or running errands as needed. Do not try to do everything.   Talk to people close to you about how you are feeling. Get support from your partner, family members, friends, or other  new moms.  Try to stay positive in how you think. Think about the things you are grateful for.   Do not spend a lot of time alone.   Only take over-the-counter or prescription medicine as directed by your health care provider.  Keep all your postpartum appointments.   Let your health care provider know if you have any concerns.  SEEK MEDICAL CARE IF: You are having a reaction to or problems with your medicine.  SEEK IMMEDIATE MEDICAL CARE IF:  You have suicidal feelings.   You think you may harm the baby or someone else. MAKE SURE YOU:  Understand these instructions.  Will watch your condition.  Will get help right away if you are not doing well or get worse. Document Released: 02/01/2004 Document Revised: 05/04/2013 Document Reviewed: 02/08/2013 [facility] Patient Information 2015 [place], [place]. This information is not intended to replace advice given to you by your health care provider. Make sure you discuss any questions you have with your health care provider. Breastfeeding and Mastitis Mastitis is inflammation of the breast tissue. It can occur in women who are breastfeeding. This can make breastfeeding painful. Mastitis will sometimes go away on its own. Your health care provider will help determine if treatment is needed. CAUSES Mastitis is often associated with a blocked milk (lactiferous) duct. This can happen when too much milk builds up in the breast. Causes of excess milk in the breast can include:  Poor latch-on. If your baby is not latched onto the breast properly, she or he may not empty your breast completely while breastfeeding.  Allowing too much time to pass between feedings.  Wearing a bra or other clothing that is too tight. This puts extra pressure on the lactiferous ducts so milk does not flow through them as it should. Mastitis can also be caused by a bacterial infection. Bacteria may enter the breast tissue through cuts or openings in the skin. In  women who are breastfeeding, this may occur because of cracked or irritated skin. Cracks in the skin are often caused when your baby does not latch on properly to the breast. SIGNS AND SYMPTOMS  Swelling, redness, tenderness, and pain in an area of the breast.  Swelling of the glands under the arm on the same side.  Fever may or may not accompany mastitis. If an infection is allowed to progress, a collection of pus (abscess) may develop. DIAGNOSIS  Your health care provider can usually diagnose mastitis based on your symptoms and a physical exam. Tests may be done to help confirm the diagnosis. These may include:  Removal of pus from the breast by applying pressure to the area. This pus can be examined in the lab to determine which bacteria are present. If an abscess has developed, the fluid in the abscess can be removed with a needle. This can also be used to confirm the diagnosis and determine the bacteria present. In most cases, pus will not be present.  Blood tests to determine if your body is fighting a bacterial infection.  Mammogram or ultrasound tests to rule out other problems or diseases. TREATMENT  Mastitis that occurs with breastfeeding will sometimes go away on its own. Your health care provider may choose to wait 24 hours after first seeing you to decide whether a prescription medicine is needed. If your symptoms are worse after 24 hours, your health care provider will likely prescribe an antibiotic medicine to treat the mastitis. He or she will determine which bacteria are most likely causing the infection and will then select an appropriate antibiotic medicine. This is sometimes changed based on the results of tests performed to identify the bacteria, or if there is no response to the antibiotic medicine selected. Antibiotic medicines are usually given by mouth. You may also be given medicine for pain. HOME CARE INSTRUCTIONS  Only take over-the-counter or prescription medicines  for pain, fever, or discomfort as directed by your health care provider.  If your health  care provider prescribed an antibiotic medicine, take the medicine as directed. Make sure you finish it even if you start to feel better.  Do not wear a tight or underwire bra. Wear a soft, supportive bra.  Increase your fluid intake, especially if you have a fever.  Continue to empty the breast. Your health care provider can tell you whether this milk is safe for your infant or needs to be thrown out. You may be told to stop nursing until your health care provider thinks it is safe for your baby. Use a breast pump if you are advised to stop nursing.  Keep your nipples clean and dry.  Empty the first breast completely before going to the other breast. If your baby is not emptying your breasts completely for some reason, use a breast pump to empty your breasts.  If you go back to work, pump your breasts while at work to stay in time with your nursing schedule.  Avoid allowing your breasts to become overly filled with milk (engorged). SEEK MEDICAL CARE IF:  You have pus-like discharge from the breast.  Your symptoms do not improve with the treatment prescribed by your health care provider within 2 days. SEEK IMMEDIATE MEDICAL CARE IF:  Your pain and swelling are getting worse.  You have pain that is not controlled with medicine.  You have a red line extending from the breast toward your armpit.  You have a fever or persistent symptoms for more than 2-3 days.  You have a fever and your symptoms suddenly get worse. MAKE SURE YOU:   Understand these instructions.  Will watch your condition.  Will get help right away if you are not doing well or get worse. Document Released: 08/24/2004 Document Revised: 05/04/2013 Document Reviewed: 12/03/2012 [facility] Patient Information 2015 [place], [place]. This information is not intended to replace advice given to you by your health care provider. Make  sure you discuss any questions you have with your health care provider. Breastfeeding Deciding to breastfeed is one of the best choices you can make for you and your baby. A change in hormones during pregnancy causes your breast tissue to grow and increases the number and size of your milk ducts. These hormones also allow proteins, sugars, and fats from your blood supply to make breast milk in your milk-producing glands. Hormones prevent breast milk from being released before your baby is born as well as prompt milk flow after birth. Once breastfeeding has begun, thoughts of your baby, as well as his or her sucking or crying, can stimulate the release of milk from your milk-producing glands.  BENEFITS OF BREASTFEEDING For Your Baby  Your first milk (colostrum) helps your baby's digestive system function better.   There are antibodies in your milk that help your baby fight off infections.   Your baby has a lower incidence of asthma, allergies, and sudden infant death syndrome.   The nutrients in breast milk are better for your baby than infant formulas and are designed uniquely for your baby's needs.   Breast milk improves your baby's brain development.   Your baby is less likely to develop other conditions, such as childhood obesity, asthma, or type 2 diabetes mellitus.  For You   Breastfeeding helps to create a very special bond between you and your baby.   Breastfeeding is convenient. Breast milk is always available at the correct temperature and costs nothing.   Breastfeeding helps to burn calories and helps you lose the weight gained during  pregnancy.   Breastfeeding makes your uterus contract to its prepregnancy size faster and slows bleeding (lochia) after you give birth.   Breastfeeding helps to lower your risk of developing type 2 diabetes mellitus, osteoporosis, and breast or ovarian cancer later in life. SIGNS THAT YOUR BABY IS HUNGRY Early Signs of  Hunger  Increased alertness or activity.  Stretching.  Movement of the head from side to side.  Movement of the head and opening of the mouth when the corner of the mouth or cheek is stroked (rooting).  Increased sucking sounds, smacking lips, cooing, sighing, or squeaking.  Hand-to-mouth movements.  Increased sucking of fingers or hands. Late Signs of Hunger  Fussing.  Intermittent crying. Extreme Signs of Hunger Signs of extreme hunger will require calming and consoling before your baby will be able to breastfeed successfully. Do not wait for the following signs of extreme hunger to occur before you initiate breastfeeding:   Restlessness.  A loud, strong cry.   Screaming. BREASTFEEDING BASICS Breastfeeding Initiation  Find a comfortable place to sit or lie down, with your neck and back well supported.  Place a pillow or rolled up blanket under your baby to bring him or her to the level of your breast (if you are seated). Nursing pillows are specially designed to help support your arms and your baby while you breastfeed.  Make sure that your baby's abdomen is facing your abdomen.   Gently massage your breast. With your fingertips, massage from your chest wall toward your nipple in a circular motion. This encourages milk flow. You may need to continue this action during the feeding if your milk flows slowly.  Support your breast with 4 fingers underneath and your thumb above your nipple. Make sure your fingers are well away from your nipple and your baby's mouth.   Stroke your baby's lips gently with your finger or nipple.   When your baby's mouth is open wide enough, quickly bring your baby to your breast, placing your entire nipple and as much of the colored area around your nipple (areola) as possible into your baby's mouth.   More areola should be visible above your baby's upper lip than below the lower lip.   Your baby's tongue should be between his or her  lower gum and your breast.   Ensure that your baby's mouth is correctly positioned around your nipple (latched). Your baby's lips should create a seal on your breast and be turned out (everted).  It is common for your baby to suck about 2-3 minutes in order to start the flow of breast milk. Latching Teaching your baby how to latch on to your breast properly is very important. An improper latch can cause nipple pain and decreased milk supply for you and poor weight gain in your baby. Also, if your baby is not latched onto your nipple properly, he or she may swallow some air during feeding. This can make your baby fussy. Burping your baby when you switch breasts during the feeding can help to get rid of the air. However, teaching your baby to latch on properly is still the best way to prevent fussiness from swallowing air while breastfeeding. Signs that your baby has successfully latched on to your nipple:    Silent tugging or silent sucking, without causing you pain.   Swallowing heard between every 3-4 sucks.    Muscle movement above and in front of his or her ears while sucking.  Signs that your baby has not  successfully latched on to nipple:   Sucking sounds or smacking sounds from your baby while breastfeeding.  Nipple pain. If you think your baby has not latched on correctly, slip your finger into the corner of your baby's mouth to break the suction and place it between your baby's gums. Attempt breastfeeding initiation again. Signs of Successful Breastfeeding Signs from your baby:   A gradual decrease in the number of sucks or complete cessation of sucking.   Falling asleep.   Relaxation of his or her body.   Retention of a small amount of milk in his or her mouth.   Letting go of your breast by himself or herself. Signs from you:  Breasts that have increased in firmness, weight, and size 1-3 hours after feeding.   Breasts that are softer immediately after  breastfeeding.  Increased milk volume, as well as a change in milk consistency and color by the fifth day of breastfeeding.   Nipples that are not sore, cracked, or bleeding. Signs That Your Pecola Leisure is Getting Enough Milk  Wetting at least 3 diapers in a 24-hour period. The urine should be clear and pale yellow by age 29 days.  At least 3 stools in a 24-hour period by age 29 days. The stool should be soft and yellow.  At least 3 stools in a 24-hour period by age 3 days. The stool should be seedy and yellow.  No loss of weight greater than 10% of birth weight during the first 28 days of age.  Average weight gain of 4-7 ounces (113-198 g) per week after age 35 days.  Consistent daily weight gain by age 29 days, without weight loss after the age of 2 weeks. After a feeding, your baby may spit up a small amount. This is common. BREASTFEEDING FREQUENCY AND DURATION Frequent feeding will help you make more milk and can prevent sore nipples and breast engorgement. Breastfeed when you feel the need to reduce the fullness of your breasts or when your baby shows signs of hunger. This is called "breastfeeding on demand." Avoid introducing a pacifier to your baby while you are working to establish breastfeeding (the first 4-6 weeks after your baby is born). After this time you may choose to use a pacifier. Research has shown that pacifier use during the first year of a baby's life decreases the risk of sudden infant death syndrome (SIDS). Allow your baby to feed on each breast as long as he or she wants. Breastfeed until your baby is finished feeding. When your baby unlatches or falls asleep while feeding from the first breast, offer the second breast. Because newborns are often sleepy in the first few weeks of life, you may need to awaken your baby to get him or her to feed. Breastfeeding times will vary from baby to baby. However, the following rules can serve as a guide to help you ensure that your baby is  properly fed:  Newborns (babies 43 weeks of age or younger) may breastfeed every 1-3 hours.  Newborns should not go longer than 3 hours during the day or 5 hours during the night without breastfeeding.  You should breastfeed your baby a minimum of 8 times in a 24-hour period until you begin to introduce solid foods to your baby at around 45 months of age. BREAST MILK PUMPING Pumping and storing breast milk allows you to ensure that your baby is exclusively fed your breast milk, even at times when you are unable to breastfeed. This is  especially important if you are going back to work while you are still breastfeeding or when you are not able to be present during feedings. Your lactation consultant can give you guidelines on how long it is safe to store breast milk.  A breast pump is a machine that allows you to pump milk from your breast into a sterile bottle. The pumped breast milk can then be stored in a refrigerator or freezer. Some breast pumps are operated by hand, while others use electricity. Ask your lactation consultant which type will work best for you. Breast pumps can be purchased, but some hospitals and breastfeeding support groups lease breast pumps on a monthly basis. A lactation consultant can teach you how to hand express breast milk, if you prefer not to use a pump.  CARING FOR YOUR BREASTS WHILE YOU BREASTFEED Nipples can become dry, cracked, and sore while breastfeeding. The following recommendations can help keep your breasts moisturized and healthy:  Avoid using soap on your nipples.   Wear a supportive bra. Although not required, special nursing bras and tank tops are designed to allow access to your breasts for breastfeeding without taking off your entire bra or top. Avoid wearing underwire-style bras or extremely tight bras.  Air dry your nipples for 3-17minutes after each feeding.   Use only cotton bra pads to absorb leaked breast milk. Leaking of breast milk between  feedings is normal.   Use lanolin on your nipples after breastfeeding. Lanolin helps to maintain your skin's normal moisture barrier. If you use pure lanolin, you do not need to wash it off before feeding your baby again. Pure lanolin is not toxic to your baby. You may also hand express a few drops of breast milk and gently massage that milk into your nipples and allow the milk to air dry. In the first few weeks after giving birth, some women experience extremely full breasts (engorgement). Engorgement can make your breasts feel heavy, warm, and tender to the touch. Engorgement peaks within 3-5 days after you give birth. The following recommendations can help ease engorgement:  Completely empty your breasts while breastfeeding or pumping. You may want to start by applying warm, moist heat (in the shower or with warm water-soaked hand towels) just before feeding or pumping. This increases circulation and helps the milk flow. If your baby does not completely empty your breasts while breastfeeding, pump any extra milk after he or she is finished.  Wear a snug bra (nursing or regular) or tank top for 1-2 days to signal your body to slightly decrease milk production.  Apply ice packs to your breasts, unless this is too uncomfortable for you.  Make sure that your baby is latched on and positioned properly while breastfeeding. If engorgement persists after 48 hours of following these recommendations, contact your health care provider or a Advertising copywriter. OVERALL HEALTH CARE RECOMMENDATIONS WHILE BREASTFEEDING  Eat healthy foods. Alternate between meals and snacks, eating 3 of each per day. Because what you eat affects your breast milk, some of the foods may make your baby more irritable than usual. Avoid eating these foods if you are sure that they are negatively affecting your baby.  Drink milk, fruit juice, and water to satisfy your thirst (about 10 glasses a day).   Rest often, relax, and  continue to take your prenatal vitamins to prevent fatigue, stress, and anemia.  Continue breast self-awareness checks.  Avoid chewing and smoking tobacco.  Avoid alcohol and drug use. Some medicines that  may be harmful to your baby can pass through breast milk. It is important to ask your health care provider before taking any medicine, including all over-the-counter and prescription medicine as well as vitamin and herbal supplements. It is possible to become pregnant while breastfeeding. If birth control is desired, ask your health care provider about options that will be safe for your baby. SEEK MEDICAL CARE IF:   You feel like you want to stop breastfeeding or have become frustrated with breastfeeding.  You have painful breasts or nipples.  Your nipples are cracked or bleeding.  Your breasts are red, tender, or warm.  You have a swollen area on either breast.  You have a fever or chills.  You have nausea or vomiting.  You have drainage other than breast milk from your nipples.  Your breasts do not become full before feedings by the fifth day after you give birth.  You feel sad and depressed.  Your baby is too sleepy to eat well.  Your baby is having trouble sleeping.   Your baby is wetting less than 3 diapers in a 24-hour period.  Your baby has less than 3 stools in a 24-hour period.  Your baby's skin or the white part of his or her eyes becomes yellow.   Your baby is not gaining weight by 18 days of age. SEEK IMMEDIATE MEDICAL CARE IF:   Your baby is overly tired (lethargic) and does not want to wake up and feed.  Your baby develops an unexplained fever. Document Released: 04/29/2005 Document Revised: 05/04/2013 Document Reviewed: 10/21/2012 [facility] Patient Information 2015 [place], [place]. This information is not intended to replace advice given to you by your health care provider. Make sure you discuss any questions you have with your health care provider.

## 2014-10-21 NOTE — Discharge Summary (Signed)
Obstetric Discharge Summary Reason for Admission: onset of labor Prenatal Procedures: ultrasound Intrapartum Procedures: spontaneous vaginal delivery Postpartum Procedures: none Complications-Operative and Postpartum: none HEMOGLOBIN  Date Value Ref Range Status  10/20/2014 10.1* 12.0 - 15.0 g/dL Final  14/78/2956 21.3 12.2 - 16.2 g/dL Final   HCT  Date Value Ref Range Status  10/20/2014 30.5* 36.0 - 46.0 % Final   HCT, POC  Date Value Ref Range Status  08/16/2013 40.7 37.7 - 47.9 % Final    Physical Exam:  General: alert, cooperative and no distress Lochia: appropriate Uterine Fundus: firm DVT Evaluation: No evidence of DVT seen on physical exam. Negative Homan's sign. No cords or calf tenderness. Significant Calf/Ankle 2+ edema is present.  Discharge Diagnoses: Term Pregnancy-delivered and Postpartum edema  Discharge Information: Date: 10/21/2014 Activity: pelvic rest Diet: routine Medications: PNV, Ibuprofen and HCTZ Condition: stable Instructions: refer to practice specific booklet Discharge to: home Follow-up Information    Follow up with Lenoard Aden, MD. Schedule an appointment as soon as possible for a visit in 1 week.   Specialty:  Obstetrics and Gynecology   Why:  re-evaluation of edema in lower extremities   Contact information:   [address] [place] [address] [phone]       Follow up with Lenoard Aden, MD. Schedule an appointment as soon as possible for a visit in 6 weeks.   Specialty:  Obstetrics and Gynecology   Why:  postpartum visit   Contact information:   [address] [place] [address] [phone]       Newborn Data: Live born female on 10/19/2014 Birth Weight: 6 lb 6.5 oz (2905 g) APGAR: 9, 9  Home with mother.  Raelyn Mora, M MSN, CNM 10/21/2014, 11:34 AM

## 2014-10-21 NOTE — Lactation Note (Addendum)
This note was copied from the chart of Jessica Quinn. Lactation Consultation Note  Patient Name: Jessica Quinn ZDGLO'V Date: 10/21/2014 Reason for consult: Follow-up assessment;Infant weight loss  2nd visit , [facility] RN Kelby Aline had mentioned to this [facility] she could see the  Side of moms nipple when the baby was latched and had a difficult time obtaining the depth . LC assessed baby's oral cavity and noted a high palate, short labial frenulum above the gum line. And cups sides of tongue upward , and is able to raise tongue , but not above the corners of his mouth, LC suspects a short posterior frenulum. LC changed baby's position to football and had mom massage breast ,  Hand express, areola compressible and worked on depth . LC did not observed dimpling in cheeks or the sides of moms nipples When the baby was latched. Multiply swallows noted , and increased with breast compressions.  Per mom comfortable with latch. Baby still feeding at 10 mins. [facility] RN Irving Burton award to document amount of time at the breast)  LC stressed the importance of obtaining depth and to use firm support for latch.  LC instructed mom on the use shells and hand pump. Mom already has comfort gels.    Maternal Data Has patient been taught Hand Expression?: Yes  Feeding Feeding Type: Breast Fed Length of feed:  (stilll feeding at 8 mins )  LATCH Score/Interventions Latch: Grasps breast easily, tongue down, lips flanged, rhythmical sucking. Intervention(s): Skin to skin;Teach feeding cues;Waking techniques  Audible Swallowing: Spontaneous and intermittent  Type of Nipple: Everted at rest and after stimulation  Comfort (Breast/Nipple): Filling, red/small blisters or bruises, mild/mod discomfort  Problem noted: Filling  Hold (Positioning): Assistance needed to correctly position infant at breast and maintain latch. Intervention(s): Breastfeeding basics reviewed;Support Pillows;Position options;Skin  to skin  LATCH Score: 8  Lactation Tools Discussed/Used Tools: Shells;Pump;Comfort gels;Flanges Flange Size: 27 Shell Type: Inverted Breast pump type: Manual   Consult Status Consult Status: Complete Date: 10/21/14    Kathrin Greathouse 10/21/2014, 12:29 PM

## 2014-10-22 LAB — TYPE AND SCREEN
ABO/RH(D): A NEG
Antibody Screen: POSITIVE
DAT, IGG: NEGATIVE
UNIT DIVISION: 0
Unit division: 0

## 2015-05-14 NOTE — L&D Delivery Note (Signed)
Delivery Note At 3:37 PM a viable and healthy female was delivered via Vaginal, Spontaneous Delivery (Presentation: LOP ).  APGAR: 9, 9; weight pending   Placenta status: Spontaneous .intact  Cord:  with the following complications: none.  Cord pH: NA  Anesthesia:  epidural Episiotomy: None Lacerations: None Suture Repair: na Est. Blood Loss (mL): 100  Mom to postpartum.  Baby to Couplet care / Skin to Skin.  Yukiko Minnich J 05/09/2016, 4:08 PM

## 2015-10-23 LAB — OB RESULTS CONSOLE HIV ANTIBODY (ROUTINE TESTING): HIV: NONREACTIVE

## 2015-10-23 LAB — OB RESULTS CONSOLE HEPATITIS B SURFACE ANTIGEN: HEP B S AG: NEGATIVE

## 2015-10-23 LAB — OB RESULTS CONSOLE ANTIBODY SCREEN: Antibody Screen: NEGATIVE

## 2015-10-23 LAB — OB RESULTS CONSOLE RPR: RPR: NONREACTIVE

## 2015-10-23 LAB — OB RESULTS CONSOLE ABO/RH: RH TYPE: NEGATIVE

## 2015-10-23 LAB — OB RESULTS CONSOLE RUBELLA ANTIBODY, IGM: RUBELLA: IMMUNE

## 2016-04-23 LAB — OB RESULTS CONSOLE GBS: STREP GROUP B AG: POSITIVE

## 2016-05-08 ENCOUNTER — Other Ambulatory Visit: Payer: Self-pay | Admitting: Obstetrics and Gynecology

## 2016-05-09 ENCOUNTER — Inpatient Hospital Stay (HOSPITAL_COMMUNITY): Payer: Managed Care, Other (non HMO) | Admitting: Anesthesiology

## 2016-05-09 ENCOUNTER — Inpatient Hospital Stay (HOSPITAL_COMMUNITY)
Admission: RE | Admit: 2016-05-09 | Discharge: 2016-05-10 | DRG: 775 | Disposition: A | Discharge status: Home / Self Care | Payer: Managed Care, Other (non HMO) | Source: Ambulatory Visit | Attending: Obstetrics and Gynecology | Admitting: Obstetrics and Gynecology

## 2016-05-09 ENCOUNTER — Encounter (HOSPITAL_COMMUNITY): Payer: Self-pay | Admitting: *Deleted

## 2016-05-09 DIAGNOSIS — D649 Anemia, unspecified: Secondary | ICD-10-CM | POA: Diagnosis present

## 2016-05-09 DIAGNOSIS — O99824 Streptococcus B carrier state complicating childbirth: Secondary | ICD-10-CM | POA: Diagnosis present

## 2016-05-09 DIAGNOSIS — O9902 Anemia complicating childbirth: Secondary | ICD-10-CM | POA: Diagnosis present

## 2016-05-09 DIAGNOSIS — O26893 Other specified pregnancy related conditions, third trimester: Principal | ICD-10-CM | POA: Diagnosis present

## 2016-05-09 DIAGNOSIS — Z3A39 39 weeks gestation of pregnancy: Secondary | ICD-10-CM | POA: Diagnosis not present

## 2016-05-09 DIAGNOSIS — M5430 Sciatica, unspecified side: Secondary | ICD-10-CM | POA: Diagnosis present

## 2016-05-09 DIAGNOSIS — Z6791 Unspecified blood type, Rh negative: Secondary | ICD-10-CM

## 2016-05-09 LAB — CBC
HCT: 30.5 % — ABNORMAL LOW (ref 36.0–46.0)
Hemoglobin: 10 g/dL — ABNORMAL LOW (ref 12.0–15.0)
MCH: 25.3 pg — ABNORMAL LOW (ref 26.0–34.0)
MCHC: 32.8 g/dL (ref 30.0–36.0)
MCV: 77.2 fL — ABNORMAL LOW (ref 78.0–100.0)
PLATELETS: 221 10*3/uL (ref 150–400)
RBC: 3.95 MIL/uL (ref 3.87–5.11)
RDW: 15.6 % — AB (ref 11.5–15.5)
WBC: 5.2 10*3/uL (ref 4.0–10.5)

## 2016-05-09 LAB — RPR: RPR: NONREACTIVE

## 2016-05-09 MED ORDER — FENTANYL CITRATE (PF) 100 MCG/2ML IJ SOLN
INTRAMUSCULAR | Status: AC
  Administered 2016-05-09: 100 ug via INTRAVENOUS
  Filled 2016-05-09: qty 2

## 2016-05-09 MED ORDER — METHYLERGONOVINE MALEATE 0.2 MG/ML IJ SOLN: 0.2000 mg | INTRAMUSCULAR | Status: DC | PRN

## 2016-05-09 MED ORDER — ONDANSETRON HCL 4 MG/2ML IJ SOLN: 4.0000 mg | INTRAMUSCULAR | Status: DC | PRN

## 2016-05-09 MED ORDER — FENTANYL 2.5 MCG/ML BUPIVACAINE 1/10 % EPIDURAL INFUSION (WH - ANES)
14.0000 mL/h | INTRAMUSCULAR | Status: DC | PRN
  Administered 2016-05-09 (×2): 14 mL/h via EPIDURAL
  Filled 2016-05-09 (×2): qty 100

## 2016-05-09 MED ORDER — LIDOCAINE HCL (PF) 1 % IJ SOLN
INTRAMUSCULAR | Status: DC | PRN
  Administered 2016-05-09 (×2): 5 mL via EPIDURAL

## 2016-05-09 MED ORDER — SIMETHICONE 80 MG PO CHEW: 80.0000 mg | CHEWABLE_TABLET | ORAL | Status: DC | PRN

## 2016-05-09 MED ORDER — LIDOCAINE HCL (PF) 1 % IJ SOLN
30.0000 mL | INTRAMUSCULAR | Status: DC | PRN
  Filled 2016-05-09: qty 30

## 2016-05-09 MED ORDER — OXYCODONE-ACETAMINOPHEN 5-325 MG PO TABS: 2.0000 | ORAL_TABLET | ORAL | Status: DC | PRN

## 2016-05-09 MED ORDER — LACTATED RINGERS IV SOLN
INTRAVENOUS | Status: DC
  Administered 2016-05-09: 08:00:00 via INTRAVENOUS

## 2016-05-09 MED ORDER — SENNOSIDES-DOCUSATE SODIUM 8.6-50 MG PO TABS
2.0000 | ORAL_TABLET | ORAL | Status: DC
  Administered 2016-05-09: 2 via ORAL
  Filled 2016-05-09: qty 2

## 2016-05-09 MED ORDER — ZOLPIDEM TARTRATE 5 MG PO TABS: 5.0000 mg | ORAL_TABLET | Freq: Every evening | ORAL | Status: DC | PRN

## 2016-05-09 MED ORDER — ONDANSETRON HCL 4 MG PO TABS: 4.0000 mg | ORAL_TABLET | ORAL | Status: DC | PRN

## 2016-05-09 MED ORDER — ACETAMINOPHEN 325 MG PO TABS: 650.0000 mg | ORAL_TABLET | ORAL | Status: DC | PRN

## 2016-05-09 MED ORDER — DEXTROSE 5 % IV SOLN
5.0000 10*6.[IU] | Freq: Once | INTRAVENOUS | Status: AC
  Administered 2016-05-09: 5 10*6.[IU] via INTRAVENOUS
  Filled 2016-05-09: qty 5

## 2016-05-09 MED ORDER — LACTATED RINGERS IV SOLN
500.0000 mL | Freq: Once | INTRAVENOUS | Status: AC
  Administered 2016-05-09: 500 mL via INTRAVENOUS

## 2016-05-09 MED ORDER — OXYTOCIN BOLUS FROM INFUSION: 500.0000 mL | Freq: Once | INTRAVENOUS | Status: DC

## 2016-05-09 MED ORDER — PHENYLEPHRINE 40 MCG/ML (10ML) SYRINGE FOR IV PUSH (FOR BLOOD PRESSURE SUPPORT)
80.0000 ug | PREFILLED_SYRINGE | INTRAVENOUS | Status: DC | PRN
  Filled 2016-05-09: qty 5

## 2016-05-09 MED ORDER — EPHEDRINE 5 MG/ML INJ
10.0000 mg | INTRAVENOUS | Status: DC | PRN
  Filled 2016-05-09: qty 4

## 2016-05-09 MED ORDER — PENICILLIN G POT IN DEXTROSE 60000 UNIT/ML IV SOLN
3.0000 10*6.[IU] | INTRAVENOUS | Status: DC
  Administered 2016-05-09: 3 10*6.[IU] via INTRAVENOUS
  Filled 2016-05-09 (×4): qty 50

## 2016-05-09 MED ORDER — OXYTOCIN 40 UNITS IN LACTATED RINGERS INFUSION - SIMPLE MED
1.0000 m[IU]/min | INTRAVENOUS | Status: DC
  Administered 2016-05-09: 2 m[IU]/min via INTRAVENOUS
  Filled 2016-05-09: qty 1000

## 2016-05-09 MED ORDER — TETANUS-DIPHTH-ACELL PERTUSSIS 5-2.5-18.5 LF-MCG/0.5 IM SUSP: 0.5000 mL | Freq: Once | INTRAMUSCULAR | Status: DC

## 2016-05-09 MED ORDER — OXYCODONE-ACETAMINOPHEN 5-325 MG PO TABS
1.0000 | ORAL_TABLET | ORAL | Status: DC | PRN
  Administered 2016-05-09: 1 via ORAL
  Filled 2016-05-09: qty 1

## 2016-05-09 MED ORDER — PENICILLIN G POTASSIUM 5000000 UNITS IJ SOLR
2.5000 10*6.[IU] | INTRAVENOUS | Status: DC
  Filled 2016-05-09: qty 2.5

## 2016-05-09 MED ORDER — PHENYLEPHRINE 40 MCG/ML (10ML) SYRINGE FOR IV PUSH (FOR BLOOD PRESSURE SUPPORT)
80.0000 ug | PREFILLED_SYRINGE | INTRAVENOUS | Status: DC | PRN
  Filled 2016-05-09: qty 10
  Filled 2016-05-09: qty 5

## 2016-05-09 MED ORDER — PRENATAL MULTIVITAMIN CH
1.0000 | ORAL_TABLET | Freq: Every day | ORAL | Status: DC
  Administered 2016-05-10: 1 via ORAL
  Filled 2016-05-09: qty 1

## 2016-05-09 MED ORDER — TERBUTALINE SULFATE 1 MG/ML IJ SOLN
0.2500 mg | Freq: Once | INTRAMUSCULAR | Status: DC | PRN
  Filled 2016-05-09: qty 1

## 2016-05-09 MED ORDER — COCONUT OIL OIL: 1.0000 "application " | TOPICAL_OIL | Status: DC | PRN

## 2016-05-09 MED ORDER — METHYLERGONOVINE MALEATE 0.2 MG PO TABS: 0.2000 mg | ORAL_TABLET | ORAL | Status: DC | PRN

## 2016-05-09 MED ORDER — FENTANYL CITRATE (PF) 100 MCG/2ML IJ SOLN
INTRAMUSCULAR | Status: DC | PRN
  Administered 2016-05-09 (×2): 50 ug via EPIDURAL

## 2016-05-09 MED ORDER — OXYCODONE-ACETAMINOPHEN 5-325 MG PO TABS: 1.0000 | ORAL_TABLET | ORAL | Status: DC | PRN

## 2016-05-09 MED ORDER — BUPIVACAINE HCL (PF) 0.25 % IJ SOLN
INTRAMUSCULAR | Status: DC | PRN
  Administered 2016-05-09 (×2): 5 mL via EPIDURAL

## 2016-05-09 MED ORDER — BENZOCAINE-MENTHOL 20-0.5 % EX AERO
1.0000 "application " | INHALATION_SPRAY | CUTANEOUS | Status: DC | PRN
  Administered 2016-05-09: 1 via TOPICAL
  Filled 2016-05-09: qty 56

## 2016-05-09 MED ORDER — LACTATED RINGERS IV SOLN: 500.0000 mL | INTRAVENOUS | Status: DC | PRN

## 2016-05-09 MED ORDER — OXYTOCIN 40 UNITS IN LACTATED RINGERS INFUSION - SIMPLE MED: 2.5000 [IU]/h | INTRAVENOUS | Status: DC

## 2016-05-09 MED ORDER — DIBUCAINE 1 % RE OINT: 1.0000 "application " | TOPICAL_OINTMENT | RECTAL | Status: DC | PRN

## 2016-05-09 MED ORDER — ONDANSETRON HCL 4 MG/2ML IJ SOLN: 4.0000 mg | Freq: Four times a day (QID) | INTRAMUSCULAR | Status: DC | PRN

## 2016-05-09 MED ORDER — WITCH HAZEL-GLYCERIN EX PADS: 1.0000 "application " | MEDICATED_PAD | CUTANEOUS | Status: DC | PRN

## 2016-05-09 MED ORDER — SOD CITRATE-CITRIC ACID 500-334 MG/5ML PO SOLN: 30.0000 mL | ORAL | Status: DC | PRN

## 2016-05-09 MED ORDER — DIPHENHYDRAMINE HCL 50 MG/ML IJ SOLN: 12.5000 mg | INTRAMUSCULAR | Status: DC | PRN

## 2016-05-09 MED ORDER — DIPHENHYDRAMINE HCL 25 MG PO CAPS: 25.0000 mg | ORAL_CAPSULE | Freq: Four times a day (QID) | ORAL | Status: DC | PRN

## 2016-05-09 MED ORDER — IBUPROFEN 600 MG PO TABS
600.0000 mg | ORAL_TABLET | Freq: Four times a day (QID) | ORAL | Status: DC
  Administered 2016-05-09 – 2016-05-10 (×4): 600 mg via ORAL
  Filled 2016-05-09 (×4): qty 1

## 2016-05-09 MED ORDER — FLEET ENEMA 7-19 GM/118ML RE ENEM: 1.0000 | ENEMA | RECTAL | Status: DC | PRN

## 2016-05-09 MED ORDER — FENTANYL CITRATE (PF) 100 MCG/2ML IJ SOLN
100.0000 ug | Freq: Once | INTRAMUSCULAR | Status: AC
  Administered 2016-05-09: 100 ug via INTRAVENOUS

## 2016-05-09 NOTE — Anesthesia Procedure Notes (Signed)
Epidural Patient location during procedure: OB Start time: 05/09/2016 10:36 AM End time: 05/09/2016 11:06 AM  Staffing Anesthesiologist: Heather RobertsSINGER, Jessica Sappington Performed: anesthesiologist   Preanesthetic Checklist Completed: patient identified, site marked, pre-op evaluation, timeout performed, IV checked, risks and benefits discussed and monitors and equipment checked  Epidural Patient position: sitting Prep: DuraPrep Patient monitoring: heart rate, cardiac monitor, continuous pulse ox and blood pressure Approach: midline Location: L2-L3 Injection technique: LOR saline  Needle:  Needle type: Tuohy  Needle gauge: 17 G Needle length: 9 cm Needle insertion depth: 8 cm Catheter size: 20 Guage Catheter at skin depth: 13 cm Test dose: negative and Other  Assessment Events: blood not aspirated, injection not painful, no injection resistance and negative IV test  Additional Notes Informed consent obtained prior to proceeding including risk of failure, 1% risk of PDPH, risk of minor discomfort and bruising.  Discussed rare but serious complications including epidural abscess, permanent nerve injury, epidural hematoma.  Discussed alternatives to epidural analgesia and patient desires to proceed.  Timeout performed pre-procedure verifying patient name, procedure, and platelet count.  Patient tolerated procedure well. Difficult palcement.

## 2016-05-09 NOTE — Anesthesia Preprocedure Evaluation (Signed)
Anesthesia Evaluation  Patient identified by MRN, date of birth, ID band Patient awake and Patient confused    Reviewed: Allergy & Precautions, H&P , NPO status , Patient's Chart, lab work & pertinent test results, reviewed documented beta blocker date and time   History of Anesthesia Complications Negative for: history of anesthetic complications  Airway Mallampati: II  TM Distance: >3 FB Neck ROM: full    Dental no notable dental hx. (+) Dental Advisory Given   Pulmonary neg pulmonary ROS,    Pulmonary exam normal breath sounds clear to auscultation       Cardiovascular Exercise Tolerance: Good negative cardio ROS Normal cardiovascular exam Rhythm:regular Rate:Normal     Neuro/Psych negative neurological ROS  negative psych ROS   GI/Hepatic negative GI ROS, Neg liver ROS,   Endo/Other  negative endocrine ROS  Renal/GU negative Renal ROS  negative genitourinary   Musculoskeletal   Abdominal   Peds  Hematology negative hematology ROS (+)   Anesthesia Other Findings   Reproductive/Obstetrics negative OB ROS (+) Pregnancy                             Anesthesia Physical  Anesthesia Plan  ASA: II  Anesthesia Plan: Epidural   Post-op Pain Management:    Induction:   Airway Management Planned:   Additional Equipment:   Intra-op Plan:   Post-operative Plan:   Informed Consent: I have reviewed the patients History and Physical, chart, labs and discussed the procedure including the risks, benefits and alternatives for the proposed anesthesia with the patient or authorized representative who has indicated his/her understanding and acceptance.   Dental Advisory Given  Plan Discussed with: CRNA  Anesthesia Plan Comments:         Anesthesia Quick Evaluation

## 2016-05-09 NOTE — Anesthesia Postprocedure Evaluation (Signed)
Anesthesia Post Note  Patient: Jessica Quinn  Procedure(s) Performed: * No procedures listed *  Patient location during evaluation: Mother Baby Anesthesia Type: Epidural Level of consciousness: awake and alert Pain management: pain level controlled Vital Signs Assessment: post-procedure vital signs reviewed and stable Respiratory status: spontaneous breathing, nonlabored ventilation and respiratory function stable Cardiovascular status: stable Postop Assessment: no headache, no backache and epidural receding Anesthetic complications: no        Last Vitals:  Vitals:   05/09/16 1720 05/09/16 1830  BP: (!) 111/58 104/60  Pulse: (!) 56 68  Resp: 18 16  Temp: 36.8 C 36.3 C    Last Pain:  Vitals:   05/09/16 1830  TempSrc: Oral  PainSc: 5    Pain Goal: Patients Stated Pain Goal: 2 (05/09/16 1830)               Jessica Quinn,Jessica Quinn

## 2016-05-09 NOTE — Lactation Note (Signed)
This note was copied from a baby's chart. Lactation Consultation Note  Patient Name: Jessica Quinn VOZDG'UToday's Date: 05/09/2016 Reason for consult: Initial assessment   Initial assessment with [facility] < [facility]. Mom reports she is unsure if she wants to BF. She reports she did not BF her 30 yo, she BF her 1518 month old for 2 months and had difficulty with sore nipples and pumping. Discussed with mom BF vs pumping and bottle feeding or formula. Mom asked if Colostrum would be good for infant, discussed benefits of BF.   Mom willing to latch infant to breast. He is STS and cueing to feed. Mom with compressible breasts and areola and large everted nipples. Colostrum easily expressible. Attempted to latch infant to right breast. Infant did not sustain a latch despite several tries. Mom did not seem interested in cotinuing to latch infant. Showed mom how to hand express colostrum and infant was spoon fed 3 cc colostrum via spoon, He tolerated it well. Enc mom to feed STS 8-12 x in 24 hours at first feeding cues. If not planning to BF enc mom to pump or hand express colostrum and feed to infant via spoon or bottle. Feeding log given with instructions for use and cleaning.   BF Resources Handout and [facility] reviewed, mom informed of IP/OP Services, BF Support Groups and LC phone #. Enc mom to call out for feeding assistance as needed. Enc mom to let Bedside RN know if she would like pump set up once she gets to [facility].  Mom has a PIS but reports she is missing some of the tubing. [facility], she is aware to call and make appt post d/c.         Maternal Data Formula Feeding for Exclusion: Yes Reason for exclusion: Mother's choice to formula and breast feed on admission Has patient been taught Hand Expression?: Yes Does the patient have breastfeeding experience prior to this delivery?: Yes  Feeding Feeding Type: Breast Fed Length of feed: 0  min  LATCH Score/Interventions Latch: Too sleepy or reluctant, no latch achieved, no sucking elicited. Intervention(s): Skin to skin;Teach feeding cues;Waking techniques  Audible Swallowing: None  Type of Nipple: Everted at rest and after stimulation  Comfort (Breast/Nipple): Soft / non-tender     Hold (Positioning): Assistance needed to correctly position infant at breast and maintain latch. Intervention(s): Breastfeeding basics reviewed;Support Pillows;Position options;Skin to skin  LATCH Score: 5  Lactation Tools Discussed/Used WIC Program: Yes   Consult Status Consult Status: Follow-up Date: 05/10/16 Follow-up type: In-patient    Jessica Quinn 05/09/2016, 4:32 PM

## 2016-05-09 NOTE — Plan of Care (Signed)
Problem: Activity: Goal: Will verbalize the importance of balancing activity with adequate rest periods Encouraged patient to call for assistance for the first two times she needed to ambulate to bathroom.

## 2016-05-09 NOTE — H&P (Signed)
Jessica Quinn is a 30 y.o. female presenting for IOL due to persistent sciatic with favorable cervix. OB History    Gravida Para Term Preterm AB Living   4 2 1 1 1 2    SAB TAB Ectopic Multiple Live Births     1   0 2     Past Medical History:  Diagnosis Date  . Medical history non-contributory   . Postpartum edema 10/20/2014  . Vaginal discharge during pregnancy in second trimester 08/06/2014   Past Surgical History:  Procedure Laterality Date  . NO PAST SURGERIES    . WISDOM TOOTH EXTRACTION     Family History: family history is not on file. Social History:  reports that she has never smoked. She has never used smokeless tobacco. She reports that she does not drink alcohol or use drugs.     Maternal Diabetes: No Genetic Screening: Normal Maternal Ultrasounds/Referrals: Normal Fetal Ultrasounds or other Referrals:  None Maternal Substance Abuse:  No Significant Maternal Medications:  None Significant Maternal Lab Results:  Lab values include: Group B Strep positive Other Comments:  None  Review of Systems  Constitutional: Negative.   All other systems reviewed and are negative.  Maternal Medical History:  Fetal activity: Perceived fetal activity is normal.   Last perceived fetal movement was within the past hour.    Prenatal complications: no prenatal complications Prenatal Complications - Diabetes: none.    Dilation: 4.5 Effacement (%): 70 Station: -1 Exam by:: soliz rn  Blood pressure 129/79, pulse 68, resp. rate 18, height 5\' 6"  (1.676 m), weight 93.4 kg (206 lb), unknown if currently breastfeeding. Maternal Exam:  Uterine Assessment: Contraction strength is mild.  Contraction frequency is rare.   Abdomen: Patient reports no abdominal tenderness. Fetal presentation: vertex  Introitus: Normal vulva. Normal vagina.  Ferning test: negative.  Amniotic fluid character: not assessed.  Pelvis: adequate for delivery.   Cervix: Cervix evaluated by  digital exam.     Physical Exam  Nursing note and vitals reviewed. Constitutional: She is oriented to person, place, and time. She appears well-developed and well-nourished.  HENT:  Head: Normocephalic and atraumatic.  Neck: Normal range of motion. Neck supple.  Cardiovascular: Normal rate and regular rhythm.   Respiratory: Effort normal and breath sounds normal.  GI: Soft. Bowel sounds are normal.  Genitourinary: Vagina normal and uterus normal.  Musculoskeletal: Normal range of motion.  Neurological: She is alert and oriented to person, place, and time. She has normal reflexes.  Skin: Skin is warm and dry.  Psychiatric: She has a normal mood and affect.    Prenatal labs: ABO, Rh: A/Negative/-- (06/12 0000) Antibody: Negative (06/12 0000) Rubella: Immune (06/12 0000) RPR: Nonreactive (06/12 0000)  HBsAg: Negative (06/12 0000)  HIV: Non-reactive (06/12 0000)  GBS: Positive (12/12 0000)   Assessment/Plan: 39 weeks Intractable sciatica IOL   Jessica Quinn J 05/09/2016, 1:04 PM

## 2016-05-09 NOTE — Progress Notes (Signed)
Patient ID: Jessica FurbishCorderia Marshall Quinn, female   DOB: 11-23-1985, 30 y.o.   MRN: [id number] Called to standby for delivery for Dr. Billy Coastaavon   S: Doing well, pain increasing, epidural re-dosed. Increased pelvic pressure and a strong urge to push.  O: Vitals:   05/09/16 1432 05/09/16 1450 05/09/16 1452 05/09/16 1457  BP: (!) 150/92 132/85 (!) 152/92 (!) 151/87  Pulse: (!) 59 (!) 54 (!) 52 (!) 55  Resp: 18 18 18 18   Weight:      Height:         FHT:  FHR: 135 bpm, variability: moderate,  accelerations:  Present,  decelerations:  Present early variables UC:   regular, every 1.5-2 minutes SVE:   Dilation: 10 Effacement (%): 100 Station: +2 - OP position Exam by:: Antawn Sison cnm SROM @ 1420, small amount of clear fluid  A / P: Induction of labor due to term with favorable cervix and and persistent sciatic nerve pain,  progressing well on pitocin  Fetal Wellbeing:  Category I Pain Control:  Epidural Pushing with good maternal efforts, anticipate delivery in 30 mins or less  Anticipated MOD:  NSVD  Kenard GowerAWSON, Daiya Tamer, M, MSN, CNM 05/09/2016, 3:04 PM

## 2016-05-09 NOTE — Anesthesia Pain Management Evaluation Note (Signed)
  CRNA Pain Management Visit Note  Patient: Jessica Quinn, 30 y.o., female  "Hello [facility]. We have an anesthesia team available at all times to provide care throughout the hospital, including epidural management and anesthesia for C-section. I don't know your plan for the delivery whether it a natural birth, water birth, IV sedation, nitrous supplementation, doula or epidural, but we want to meet your pain goals."   1.Was your pain managed to your expectations on prior hospitalizations?   Yes   2.What is your expectation for pain management during this hospitalization?     Epidural  3.How can we help you reach that goal?epidural  Record the patient's initial score and the patient's pain goal.   Pain: 0/10  Pain Goal: 0/10 [facility] wants you to be able to say your pain was always managed very well.  Jessica Quinn, Jessica Quinn 05/09/2016

## 2016-05-10 DIAGNOSIS — O9902 Anemia complicating childbirth: Secondary | ICD-10-CM | POA: Diagnosis present

## 2016-05-10 LAB — CBC
HCT: 27.3 % — ABNORMAL LOW (ref 36.0–46.0)
HEMOGLOBIN: 8.9 g/dL — AB (ref 12.0–15.0)
MCH: 25.2 pg — ABNORMAL LOW (ref 26.0–34.0)
MCHC: 32.6 g/dL (ref 30.0–36.0)
MCV: 77.3 fL — ABNORMAL LOW (ref 78.0–100.0)
Platelets: 200 10*3/uL (ref 150–400)
RBC: 3.53 MIL/uL — AB (ref 3.87–5.11)
RDW: 15.9 % — ABNORMAL HIGH (ref 11.5–15.5)
WBC: 9.5 10*3/uL (ref 4.0–10.5)

## 2016-05-10 MED ORDER — IBUPROFEN 600 MG PO TABS: 600.0000 mg | ORAL_TABLET | Freq: Four times a day (QID) | ORAL | 0 refills | Status: DC

## 2016-05-10 MED ORDER — MAGNESIUM OXIDE 400 (241.3 MG) MG PO TABS
400.0000 mg | ORAL_TABLET | Freq: Every day | ORAL | Status: DC
  Administered 2016-05-10: 400 mg via ORAL
  Filled 2016-05-10 (×2): qty 1

## 2016-05-10 MED ORDER — POLYSACCHARIDE IRON COMPLEX 150 MG PO CAPS: 150.0000 mg | ORAL_CAPSULE | Freq: Every day | ORAL | 1 refills | Status: DC

## 2016-05-10 MED ORDER — MAGNESIUM OXIDE 400 (241.3 MG) MG PO TABS: 400.0000 mg | ORAL_TABLET | Freq: Every day | ORAL | Status: DC

## 2016-05-10 MED ORDER — BENZOCAINE-MENTHOL 20-0.5 % EX AERO: 1.0000 "application " | INHALATION_SPRAY | CUTANEOUS | Status: DC | PRN

## 2016-05-10 MED ORDER — POLYSACCHARIDE IRON COMPLEX 150 MG PO CAPS
150.0000 mg | ORAL_CAPSULE | Freq: Every day | ORAL | Status: DC
  Administered 2016-05-10: 150 mg via ORAL
  Filled 2016-05-10: qty 1

## 2016-05-10 MED ORDER — RHO D IMMUNE GLOBULIN 1500 UNIT/2ML IJ SOSY
300.0000 ug | PREFILLED_SYRINGE | Freq: Once | INTRAMUSCULAR | Status: AC
  Administered 2016-05-10: 300 ug via INTRAVENOUS
  Filled 2016-05-10: qty 2

## 2016-05-10 NOTE — Progress Notes (Signed)
PPD # 1 SVD Information for the patient's newborn:  Carrie MewStrayhorn, Boy Eneida [[id number]][[id number]]  female    Bottle feeding  / Circumcision planned   S:  Reports feeling well, sore bottom. Desires discharge home today.             Tolerating po/ No nausea or vomiting             Bleeding is moderate             Pain controlled with ibuprofen (OTC)             Up ad lib / ambulatory / voiding without difficulties        O:  A & O x 3, in no apparent distress              VS:  Vitals:   05/09/16 1720 05/09/16 1830 05/09/16 2230 05/10/16 0620  BP: (!) 111/58 104/60 116/62 112/69  Pulse: (!) 56 68 70 62  Resp: 18 16 18 18   Temp: 98.2 F (36.8 C) 97.4 F (36.3 C) 98.2 F (36.8 C) 97.8 F (36.6 C)  TempSrc: Oral Oral Oral Oral  SpO2: 100% 100% 100% 100%  Weight:      Height:        LABS:  Recent Labs  05/09/16 0800 05/10/16 0536  WBC 5.2 9.5  HGB 10.0* 8.9*  HCT 30.5* 27.3*  PLT 221 200    Blood type: A/Negative/-- (06/12 0000)  Rubella: Immune (06/12 0000)   I&O: I/O last 3 completed shifts: In: -  Out: 100 [Blood:100]          No intake/output data recorded.  Lungs: Clear and unlabored  Heart: regular rate and rhythm / no murmurs  Abdomen: soft, non-tender, non-distended             Fundus: firm, non-tender, U-1  Perineum: mild edema, intact  Lochia: small  Extremities: + edema, no calf pain or tenderness    A/P: PPD # 1 30 y.o., [id number]   Principal Problem:   Postpartum care following vaginal delivery (12/28) Active Problems:   Anemia of mother in pregnancy, delivered RH negative mother with Rh pos infant  Rhophylac prior to DC  Start oral Fe and Mag Ox 400 mg daily  Doing well - stable status  Routine post partum orders              DC home today w/ instructions  [facility] and PRN    Neta Mendsaniela C Paul, MSN, CNM 05/10/2016, 9:06 AM

## 2016-05-10 NOTE — Plan of Care (Signed)
Problem: Education: Goal: Knowledge of condition will improve Admission paperwork reviewed with patient and family member on admission. Continued education on breast care with bottle feeding.

## 2016-05-10 NOTE — Discharge Summary (Signed)
Obstetric Discharge Summary Reason for Admission: induction of labor and elective Prenatal Procedures: ultrasound Intrapartum Procedures: spontaneous vaginal delivery, GBS prophylaxis and epidural Postpartum Procedures: Rho(D) Ig Complications-Operative and Postpartum: none Hemoglobin  Date Value Ref Range Status  05/10/2016 8.9 (L) 12.0 - 15.0 g/dL Final   HCT  Date Value Ref Range Status  05/10/2016 27.3 (L) 36.0 - 46.0 % Final    Physical Exam:  General: alert, cooperative and no distress Lochia: appropriate Uterine Fundus: firm Incision: n/a DVT Evaluation: No cords or calf tenderness. Calf/Ankle edema is present.  Discharge Diagnoses: Term Pregnancy-delivered and anemia  Discharge Information: Date: 05/10/2016 Activity: pelvic rest Diet: routine Medications: PNV, Ibuprofen, Iron and MagOx Condition: stable Instructions: refer to practice specific booklet Discharge to: home Follow-up Information    [name],[name], MD. Schedule an appointment as soon as possible for a visit in 6 week(s).   Specialty:  Obstetrics and Gynecology Contact information: [address] [place] [address] [phone]           Newborn Data: Live born female  Birth Weight: 6 lb 11.1 oz (3035 g) APGAR: 9, 9  Home with mother.  Jessica Quinn 05/10/2016, 9:21 AM

## 2016-05-11 LAB — RH IG WORKUP (INCLUDES ABO/RH)
ABO/RH(D): A NEG
FETAL SCREEN: NEGATIVE
Gestational Age(Wks): 39
UNIT DIVISION: 0

## 2016-05-13 LAB — TYPE AND SCREEN
ABO/RH(D): A NEG
ANTIBODY SCREEN: POSITIVE
Unit division: 0
Unit division: 0

## 2017-04-14 LAB — OB RESULTS CONSOLE ABO/RH: RH Type: NEGATIVE

## 2017-04-14 LAB — OB RESULTS CONSOLE RUBELLA ANTIBODY, IGM: RUBELLA: IMMUNE

## 2017-04-14 LAB — OB RESULTS CONSOLE HEPATITIS B SURFACE ANTIGEN: HEP B S AG: NEGATIVE

## 2017-04-14 LAB — OB RESULTS CONSOLE HIV ANTIBODY (ROUTINE TESTING): HIV: NONREACTIVE

## 2017-04-14 LAB — OB RESULTS CONSOLE RPR: RPR: NONREACTIVE

## 2017-04-14 LAB — OB RESULTS CONSOLE ANTIBODY SCREEN: ANTIBODY SCREEN: NEGATIVE

## 2017-04-14 LAB — OB RESULTS CONSOLE GC/CHLAMYDIA
Chlamydia: NEGATIVE
GC PROBE AMP, GENITAL: NEGATIVE

## 2017-05-13 NOTE — L&D Delivery Note (Signed)
Delivery Note At 11:57 AM a viable and healthy female was delivered via Vaginal, Spontaneous (Presentation: ;LOA ).  APGAR: 9, 10; weight pending   Placenta status: spontaneous, intact .  Cord:  with the following complications: none.  Cord pH: na  Anesthesia:  epidural Episiotomy: None Lacerations: None Suture Repair: na Est. Blood Loss (mL): 100  Mom to postpartum.  Baby to Couplet care / Skin to Skin.  Nissim Fleischer J 11/10/2017, 12:21 PM

## 2017-07-27 ENCOUNTER — Inpatient Hospital Stay (HOSPITAL_COMMUNITY)
Admission: AD | Admit: 2017-07-27 | Discharge: 2017-07-27 | Disposition: A | Discharge status: Home / Self Care | Payer: Managed Care, Other (non HMO) | Source: Ambulatory Visit | Attending: Obstetrics and Gynecology | Admitting: Obstetrics and Gynecology

## 2017-07-27 ENCOUNTER — Encounter (HOSPITAL_COMMUNITY): Payer: Self-pay

## 2017-07-27 ENCOUNTER — Other Ambulatory Visit: Payer: Self-pay

## 2017-07-27 DIAGNOSIS — M545 Low back pain: Secondary | ICD-10-CM | POA: Insufficient documentation

## 2017-07-27 DIAGNOSIS — Z3689 Encounter for other specified antenatal screening: Secondary | ICD-10-CM

## 2017-07-27 DIAGNOSIS — M549 Dorsalgia, unspecified: Secondary | ICD-10-CM

## 2017-07-27 DIAGNOSIS — Z3A24 24 weeks gestation of pregnancy: Secondary | ICD-10-CM | POA: Diagnosis not present

## 2017-07-27 DIAGNOSIS — O26892 Other specified pregnancy related conditions, second trimester: Secondary | ICD-10-CM | POA: Diagnosis present

## 2017-07-27 LAB — URINALYSIS, ROUTINE W REFLEX MICROSCOPIC
Bilirubin Urine: NEGATIVE
GLUCOSE, UA: NEGATIVE mg/dL
HGB URINE DIPSTICK: NEGATIVE
KETONES UR: NEGATIVE mg/dL
LEUKOCYTES UA: NEGATIVE
Nitrite: NEGATIVE
PROTEIN: NEGATIVE mg/dL
Specific Gravity, Urine: 1.028 (ref 1.005–1.030)
pH: 5 (ref 5.0–8.0)

## 2017-07-27 MED ORDER — CYCLOBENZAPRINE HCL 10 MG PO TABS: 10.0000 mg | ORAL_TABLET | Freq: Three times a day (TID) | ORAL | 0 refills | Status: DC | PRN

## 2017-07-27 MED ORDER — CYCLOBENZAPRINE HCL 10 MG PO TABS
10.0000 mg | ORAL_TABLET | Freq: Three times a day (TID) | ORAL | Status: DC | PRN
  Administered 2017-07-27: 10 mg via ORAL
  Filled 2017-07-27 (×2): qty 1

## 2017-07-27 NOTE — MAU Provider Note (Signed)
History     CSN: [id number]  Arrival date and time: 07/27/17 1525   First Provider Initiated Contact with Patient 07/27/17 1618      Chief Complaint  Patient presents with  . Back Pain   [id number] @24 .0 wks here with LBP. Pain is constant and started this am in bilateral lower back. Rates 5/10. Has not used anything for it. No recent pushing, pulling, or lifting. No urinary sx. Also reports having an episode of coughing earlier after taking a sip of soda. Admits to drinking only 1 bottle of water today. Since then she had some aching in her chest. No pressure. No jaw pain or pain radiating into arm. Reports good FM. No VB, LOF, or ctx.    OB History    Gravida Para Term Preterm AB Living   5 3 2 1 1 3    SAB TAB Ectopic Multiple Live Births     1   0 3      Past Medical History:  Diagnosis Date  . Medical history non-contributory   . Postpartum care following vaginal delivery (12/28) 05/09/2016  . Postpartum edema 10/20/2014  . Vaginal discharge during pregnancy in second trimester 08/06/2014    Past Surgical History:  Procedure Laterality Date  . NO PAST SURGERIES    . WISDOM TOOTH EXTRACTION      No family history on file.  Social History   Tobacco Use  . Smoking status: Never Smoker  . Smokeless tobacco: Never Used  Substance Use Topics  . Alcohol use: No  . Drug use: No    Allergies:  Allergies  Allergen Reactions  . Sulfa Antibiotics Rash    Medications Prior to Admission  Medication Sig Dispense Refill Last Dose  . benzocaine-Menthol (DERMOPLAST) 20-0.5 % AERO Apply 1 application topically as needed for irritation (perineal discomfort).     . calcium carbonate (TUMS - DOSED IN MG ELEMENTAL CALCIUM) 500 MG chewable tablet Chew 2 tablets by mouth daily as needed for indigestion or heartburn.   05/07/2016  . ibuprofen (ADVIL,MOTRIN) 600 MG tablet Take 1 tablet (600 mg total) by mouth every 6 (six) hours. 30 tablet 0   . iron polysaccharides (NIFEREX) 150 MG  capsule Take 1 capsule (150 mg total) by mouth daily. 30 capsule 1   . magnesium oxide (MAG-OX) 400 (241.3 Mg) MG tablet Take 1 tablet (400 mg total) by mouth daily.     . Prenatal Vit-Fe Fumarate-FA (PRENATAL MULTIVITAMIN) TABS tablet Take 1 tablet by mouth daily.   05/08/2016 at Unknown time    Review of Systems  Respiratory: Negative for shortness of breath.   Gastrointestinal: Negative for abdominal pain.  Genitourinary: Negative for vaginal bleeding and vaginal discharge.  Musculoskeletal: Positive for back pain.   Physical Exam   Blood pressure 110/72, pulse 98, temperature 98.5 F (36.9 C), temperature source Oral, resp. rate 16, height 5\' 6"  (1.676 m), weight 186 lb 6.4 oz (84.6 kg), unknown if currently breastfeeding. SpO2 99%  Physical Exam  Nursing note and vitals reviewed. Constitutional: She is oriented to person, place, and time. She appears well-developed and well-nourished. No distress.  HENT:  Head: Normocephalic and atraumatic.  Neck: Normal range of motion.  Cardiovascular: Normal rate, regular rhythm and normal heart sounds.  Respiratory: Effort normal and breath sounds normal. No respiratory distress. She has no wheezes. She has no rales.  GI: Soft. She exhibits no distension. There is no tenderness. There is no CVA tenderness.  gravid  Genitourinary:  Genitourinary  Comments: Cervix closed/50  Musculoskeletal: Normal range of motion.       Cervical back: Normal. She exhibits no tenderness.       Thoracic back: Normal. She exhibits no tenderness.       Lumbar back: She exhibits tenderness.  Neurological: She is alert and oriented to person, place, and time.  Skin: Skin is warm and dry.  Psychiatric: She has a normal mood and affect.  EFM: 155 bpm, mod variability, + accels, no decels Toco: none  Results for orders placed or performed during the hospital encounter of 07/27/17 (from the past 24 hour(s))  Urinalysis, Routine w reflex microscopic     Status:  Abnormal   Collection Time: 07/27/17  3:48 PM  Result Value Ref Range   Color, Urine YELLOW YELLOW   APPearance HAZY (A) CLEAR   Specific Gravity, Urine 1.028 1.005 - 1.030   pH 5.0 5.0 - 8.0   Glucose, UA NEGATIVE NEGATIVE mg/dL   Hgb urine dipstick NEGATIVE NEGATIVE   Bilirubin Urine NEGATIVE NEGATIVE   Ketones, ur NEGATIVE NEGATIVE mg/dL   Protein, ur NEGATIVE NEGATIVE mg/dL   Nitrite NEGATIVE NEGATIVE   Leukocytes, UA NEGATIVE NEGATIVE   MAU Course  Procedures Flexeril Heating pad Po hydration  MDM: Labs ordered and reviewed. No evidence of UTI or PTL. Chest discomfort likely MSK d/t coughing episode. Back and chest pain resolved. Presentation, clinical findings, and plan discussed with Dr. Billy Coastaavon. Stable for discharge home.  Assessment and Plan   1. [redacted] weeks gestation of pregnancy   2. NST (non-stress test) reactive   3. Musculoskeletal back pain    Discharge home Follow up in OB office as scheduled Rx Flexeril PTL precautions Improve hydration  Allergies as of 07/27/2017      Reactions   Sulfa Antibiotics Rash      Medication List    STOP taking these medications   benzocaine-Menthol 20-0.5 % Aero Commonly known as:  DERMOPLAST   ibuprofen 600 MG tablet Commonly known as:  ADVIL,MOTRIN   iron polysaccharides 150 MG capsule Commonly known as:  NIFEREX   magnesium oxide 400 (241.3 Mg) MG tablet Commonly known as:  MAG-OX     TAKE these medications   calcium carbonate 500 MG chewable tablet Commonly known as:  TUMS - dosed in mg elemental calcium Chew 2 tablets by mouth daily as needed for indigestion or heartburn.   cyclobenzaprine 10 MG tablet Commonly known as:  FLEXERIL Take 1 tablet (10 mg total) by mouth 3 (three) times daily as needed for muscle spasms.   prenatal multivitamin Tabs tablet Take 1 tablet by mouth daily.      Donette LarryMelanie Grayling Schranz, CNM 07/27/2017, 5:46 PM

## 2017-07-27 NOTE — Discharge Instructions (Signed)
Back Pain in Pregnancy Back pain during pregnancy is common. Back pain may be caused by several factors that are related to changes during your pregnancy. Follow these instructions at home: Managing pain, stiffness, and swelling  If directed, apply ice for sudden (acute) back pain. ? Put ice in a plastic bag. ? Place a towel between your skin and the bag. ? Leave the ice on for 20 minutes, 2-3 times per day.  If directed, apply heat to the affected area before you exercise: ? Place a towel between your skin and the heat pack or heating pad. ? Leave the heat on for 20-30 minutes. ? Remove the heat if your skin turns bright red. This is especially important if you are unable to feel pain, heat, or cold. You may have a greater risk of getting burned. Activity  Exercise as told by your health care provider. Exercising is the best way to prevent or manage back pain.  Listen to your body when lifting. If lifting hurts, ask for help or bend your knees. This uses your leg muscles instead of your back muscles.  Squat down when picking up something from the floor. Do not bend over.  Only use bed rest as told by your health care provider. Bed rest should only be used for the most severe episodes of back pain. Standing, Sitting, and Lying Down  Do not stand in one place for long periods of time.  Use good posture when sitting. Make sure your head rests over your shoulders and is not hanging forward. Use a pillow on your lower back if necessary.  Try sleeping on your side, preferably the left side, with a pillow or two between your legs. If you are sore after a night's rest, your bed may be too soft. A firm mattress may provide more support for your back during pregnancy. General instructions  Do not wear high heels.  Eat a healthy diet. Try to gain weight within your health care provider's recommendations.  Use a maternity girdle, elastic sling, or back brace as told by your health care  provider.  Take over-the-counter and prescription medicines only as told by your health care provider.  Keep all follow-up visits as told by your health care provider. This is important. This includes any visits with any specialists, such as a physical therapist. Contact a health care provider if:  Your back pain interferes with your daily activities.  You have increasing pain in other parts of your body. Get help right away if:  You develop numbness, tingling, weakness, or problems with the use of your arms or legs.  You develop severe back pain that is not controlled with medicine.  You have a sudden change in bowel or bladder control.  You develop shortness of breath, dizziness, or you faint.  You develop nausea, vomiting, or sweating.  You have back pain that is a rhythmic, cramping pain similar to labor pains. Labor pain is usually 1-2 minutes apart, lasts for about 1 minute, and involves a bearing down feeling or pressure in your pelvis.  You have back pain and your water breaks or you have vaginal bleeding.  You have back pain or numbness that travels down your leg.  Your back pain developed after you fell.  You develop pain on one side of your back.  You see blood in your urine.  You develop skin blisters in the area of your back pain. This information is not intended to replace advice given to you   by your health care provider. Make sure you discuss any questions you have with your health care provider. Document Released: 08/07/2005 Document Revised: 10/05/2015 Document Reviewed: 01/11/2015 Elsevier Interactive Patient Education  2018 [employer]  

## 2017-07-27 NOTE — MAU Note (Signed)
Presents with c/o back pain that began @ 0430 this morning.  States been having regular U/S for her cervix.  Denies VB or LOF.  Reports +FM.

## 2017-07-27 NOTE — Progress Notes (Addendum)
G5P2 @ [redacted] wksga. Here dt back and chest pain that started early this morning. Took tylenol 1gm and states helped some. Denies LOF or bleeding. +FM/flutter.   VSS see flow sheet for details. RA O2 sat at 99%  1735: provider at bs reassessing and doing SVE. Closed  D/c orders received.   1740: D/c instructions given with pt understanding. Pt left unit via ambulatory with SO.

## 2017-10-24 LAB — OB RESULTS CONSOLE GBS: GBS: POSITIVE

## 2017-10-30 ENCOUNTER — Encounter (HOSPITAL_COMMUNITY): Payer: Self-pay | Admitting: *Deleted

## 2017-10-30 ENCOUNTER — Telehealth (HOSPITAL_COMMUNITY): Payer: Self-pay | Admitting: *Deleted

## 2017-10-30 NOTE — Telephone Encounter (Signed)
Preadmission screen  

## 2017-11-05 ENCOUNTER — Other Ambulatory Visit: Payer: Self-pay | Admitting: Obstetrics and Gynecology

## 2017-11-10 ENCOUNTER — Inpatient Hospital Stay (HOSPITAL_COMMUNITY)
Admission: RE | Admit: 2017-11-10 | Discharge: 2017-11-12 | DRG: 807 | Disposition: A | Discharge status: Home / Self Care | Payer: Managed Care, Other (non HMO) | Attending: Obstetrics and Gynecology | Admitting: Obstetrics and Gynecology

## 2017-11-10 ENCOUNTER — Inpatient Hospital Stay (HOSPITAL_COMMUNITY): Payer: Managed Care, Other (non HMO) | Admitting: Anesthesiology

## 2017-11-10 ENCOUNTER — Encounter (HOSPITAL_COMMUNITY): Payer: Self-pay

## 2017-11-10 DIAGNOSIS — O99824 Streptococcus B carrier state complicating childbirth: Principal | ICD-10-CM | POA: Diagnosis present

## 2017-11-10 DIAGNOSIS — Z3490 Encounter for supervision of normal pregnancy, unspecified, unspecified trimester: Secondary | ICD-10-CM | POA: Diagnosis present

## 2017-11-10 DIAGNOSIS — D509 Iron deficiency anemia, unspecified: Secondary | ICD-10-CM | POA: Diagnosis present

## 2017-11-10 DIAGNOSIS — Z3A39 39 weeks gestation of pregnancy: Secondary | ICD-10-CM | POA: Diagnosis not present

## 2017-11-10 DIAGNOSIS — Z3483 Encounter for supervision of other normal pregnancy, third trimester: Secondary | ICD-10-CM | POA: Diagnosis present

## 2017-11-10 LAB — SYPHILIS: RPR W/REFLEX TO RPR TITER AND TREPONEMAL ANTIBODIES, TRADITIONAL SCREENING AND DIAGNOSIS ALGORITHM: RPR Ser Ql: NONREACTIVE

## 2017-11-10 LAB — CBC
HCT: 31.4 % — ABNORMAL LOW (ref 36.0–46.0)
Hemoglobin: 10.2 g/dL — ABNORMAL LOW (ref 12.0–15.0)
MCH: 25.4 pg — ABNORMAL LOW (ref 26.0–34.0)
MCHC: 32.5 g/dL (ref 30.0–36.0)
MCV: 78.3 fL (ref 78.0–100.0)
Platelets: 223 K/uL (ref 150–400)
RBC: 4.01 MIL/uL (ref 3.87–5.11)
RDW: 15.9 % — ABNORMAL HIGH (ref 11.5–15.5)
WBC: 6.5 K/uL (ref 4.0–10.5)

## 2017-11-10 LAB — TYPE AND SCREEN
ABO/RH(D): A NEG
Antibody Screen: NEGATIVE

## 2017-11-10 MED ORDER — COCONUT OIL OIL
1.0000 "application " | TOPICAL_OIL | Status: DC | PRN
  Administered 2017-11-11: 1 via TOPICAL
  Filled 2017-11-10: qty 120

## 2017-11-10 MED ORDER — ACETAMINOPHEN 325 MG PO TABS
650.0000 mg | ORAL_TABLET | ORAL | Status: DC | PRN
  Administered 2017-11-10: 650 mg via ORAL
  Filled 2017-11-10: qty 2

## 2017-11-10 MED ORDER — EPHEDRINE 5 MG/ML INJ
10.0000 mg | INTRAVENOUS | Status: DC | PRN
  Filled 2017-11-10: qty 2

## 2017-11-10 MED ORDER — OXYTOCIN 40 UNITS IN LACTATED RINGERS INFUSION - SIMPLE MED
1.0000 m[IU]/min | INTRAVENOUS | Status: DC
  Administered 2017-11-10: 2 m[IU]/min via INTRAVENOUS
  Filled 2017-11-10: qty 1000

## 2017-11-10 MED ORDER — LACTATED RINGERS IV SOLN: 500.0000 mL | Freq: Once | INTRAVENOUS | Status: DC

## 2017-11-10 MED ORDER — METHYLERGONOVINE MALEATE 0.2 MG PO TABS: 0.2000 mg | ORAL_TABLET | ORAL | Status: DC | PRN

## 2017-11-10 MED ORDER — METHYLERGONOVINE MALEATE 0.2 MG/ML IJ SOLN: 0.2000 mg | INTRAMUSCULAR | Status: DC | PRN

## 2017-11-10 MED ORDER — ZOLPIDEM TARTRATE 5 MG PO TABS: 5.0000 mg | ORAL_TABLET | Freq: Every evening | ORAL | Status: DC | PRN

## 2017-11-10 MED ORDER — WITCH HAZEL-GLYCERIN EX PADS: 1.0000 "application " | MEDICATED_PAD | CUTANEOUS | Status: DC | PRN

## 2017-11-10 MED ORDER — DIBUCAINE 1 % RE OINT: 1.0000 "application " | TOPICAL_OINTMENT | RECTAL | Status: DC | PRN

## 2017-11-10 MED ORDER — ACETAMINOPHEN 325 MG PO TABS: 650.0000 mg | ORAL_TABLET | ORAL | Status: DC | PRN

## 2017-11-10 MED ORDER — PHENYLEPHRINE 40 MCG/ML (10ML) SYRINGE FOR IV PUSH (FOR BLOOD PRESSURE SUPPORT)
80.0000 ug | PREFILLED_SYRINGE | INTRAVENOUS | Status: DC | PRN
  Filled 2017-11-10: qty 5

## 2017-11-10 MED ORDER — SENNOSIDES-DOCUSATE SODIUM 8.6-50 MG PO TABS
2.0000 | ORAL_TABLET | ORAL | Status: DC
  Administered 2017-11-10 – 2017-11-11 (×2): 2 via ORAL
  Filled 2017-11-10 (×2): qty 2

## 2017-11-10 MED ORDER — SODIUM CHLORIDE 0.9 % IV SOLN
5.0000 10*6.[IU] | Freq: Once | INTRAVENOUS | Status: AC
  Administered 2017-11-10: 5 10*6.[IU] via INTRAVENOUS
  Filled 2017-11-10: qty 5

## 2017-11-10 MED ORDER — SOD CITRATE-CITRIC ACID 500-334 MG/5ML PO SOLN: 30.0000 mL | ORAL | Status: DC | PRN

## 2017-11-10 MED ORDER — LACTATED RINGERS IV SOLN
INTRAVENOUS | Status: DC
  Administered 2017-11-10: 08:00:00 via INTRAVENOUS

## 2017-11-10 MED ORDER — DIPHENHYDRAMINE HCL 50 MG/ML IJ SOLN: 12.5000 mg | INTRAMUSCULAR | Status: DC | PRN

## 2017-11-10 MED ORDER — PENICILLIN G POT IN DEXTROSE 60000 UNIT/ML IV SOLN
3.0000 10*6.[IU] | INTRAVENOUS | Status: DC
  Filled 2017-11-10 (×7): qty 50

## 2017-11-10 MED ORDER — LIDOCAINE HCL (PF) 1 % IJ SOLN
INTRAMUSCULAR | Status: DC | PRN
  Administered 2017-11-10 (×2): 5 mL via EPIDURAL

## 2017-11-10 MED ORDER — FENTANYL 2.5 MCG/ML BUPIVACAINE 1/10 % EPIDURAL INFUSION (WH - ANES)
14.0000 mL/h | INTRAMUSCULAR | Status: DC | PRN
  Administered 2017-11-10: 14 mL/h via EPIDURAL
  Filled 2017-11-10: qty 100

## 2017-11-10 MED ORDER — OXYCODONE-ACETAMINOPHEN 5-325 MG PO TABS: 2.0000 | ORAL_TABLET | ORAL | Status: DC | PRN

## 2017-11-10 MED ORDER — OXYCODONE-ACETAMINOPHEN 5-325 MG PO TABS
1.0000 | ORAL_TABLET | ORAL | Status: DC | PRN
  Administered 2017-11-10 – 2017-11-11 (×3): 1 via ORAL
  Filled 2017-11-10 (×3): qty 1

## 2017-11-10 MED ORDER — ONDANSETRON HCL 4 MG PO TABS: 4.0000 mg | ORAL_TABLET | ORAL | Status: DC | PRN

## 2017-11-10 MED ORDER — BENZOCAINE-MENTHOL 20-0.5 % EX AERO: 1.0000 "application " | INHALATION_SPRAY | CUTANEOUS | Status: DC | PRN

## 2017-11-10 MED ORDER — IBUPROFEN 600 MG PO TABS
600.0000 mg | ORAL_TABLET | Freq: Four times a day (QID) | ORAL | Status: DC
  Administered 2017-11-10 – 2017-11-12 (×8): 600 mg via ORAL
  Filled 2017-11-10 (×8): qty 1

## 2017-11-10 MED ORDER — LACTATED RINGERS IV SOLN: 500.0000 mL | INTRAVENOUS | Status: DC | PRN

## 2017-11-10 MED ORDER — ONDANSETRON HCL 4 MG/2ML IJ SOLN: 4.0000 mg | INTRAMUSCULAR | Status: DC | PRN

## 2017-11-10 MED ORDER — SIMETHICONE 80 MG PO CHEW: 80.0000 mg | CHEWABLE_TABLET | ORAL | Status: DC | PRN

## 2017-11-10 MED ORDER — DIPHENHYDRAMINE HCL 25 MG PO CAPS
25.0000 mg | ORAL_CAPSULE | Freq: Four times a day (QID) | ORAL | Status: DC | PRN
Start: 2017-11-10 — End: 2017-11-12

## 2017-11-10 MED ORDER — PHENYLEPHRINE 40 MCG/ML (10ML) SYRINGE FOR IV PUSH (FOR BLOOD PRESSURE SUPPORT)
80.0000 ug | PREFILLED_SYRINGE | INTRAVENOUS | Status: DC | PRN
  Filled 2017-11-10: qty 10
  Filled 2017-11-10: qty 5

## 2017-11-10 MED ORDER — ONDANSETRON HCL 4 MG/2ML IJ SOLN: 4.0000 mg | Freq: Four times a day (QID) | INTRAMUSCULAR | Status: DC | PRN

## 2017-11-10 MED ORDER — PRENATAL MULTIVITAMIN CH
1.0000 | ORAL_TABLET | Freq: Every day | ORAL | Status: DC
  Administered 2017-11-11 – 2017-11-12 (×2): 1 via ORAL
  Filled 2017-11-10 (×2): qty 1

## 2017-11-10 MED ORDER — TERBUTALINE SULFATE 1 MG/ML IJ SOLN
0.2500 mg | Freq: Once | INTRAMUSCULAR | Status: DC | PRN
  Filled 2017-11-10: qty 1

## 2017-11-10 MED ORDER — TETANUS-DIPHTH-ACELL PERTUSSIS 5-2.5-18.5 LF-MCG/0.5 IM SUSP: 0.5000 mL | Freq: Once | INTRAMUSCULAR | Status: DC

## 2017-11-10 NOTE — Anesthesia Postprocedure Evaluation (Signed)
Anesthesia Post Note  Patient: Jessica Quinn, television/film setMarshall Tomasini  Procedure(s) Performed: AN AD HOC LABOR EPIDURAL     Patient location during evaluation: Mother Baby Anesthesia Type: Epidural Level of consciousness: awake and alert Pain management: pain level controlled Vital Signs Assessment: post-procedure vital signs reviewed and stable Respiratory status: spontaneous breathing Cardiovascular status: blood pressure returned to baseline Postop Assessment: no headache, no backache, epidural receding, able to ambulate, adequate PO intake and patient able to bend at knees Anesthetic complications: no    Last Vitals:  Vitals:   11/10/17 1430 11/10/17 1536  BP: 105/65 100/67  Pulse: 62 79  Resp: 18 17  Temp: 36.9 C 36.7 C  SpO2:      Last Pain:  Vitals:   11/10/17 1536  TempSrc: Oral  PainSc: 0-No pain   Pain Goal:                 [place]

## 2017-11-10 NOTE — H&P (Signed)
Jessica Quinn is a 32 y.o. female presenting for IOL for history of precipitous labor and GBS pos at 39 weeks. OB History    Gravida  5   Para  3   Term  2   Preterm  1   AB  1   Living  3     SAB      TAB  1   Ectopic      Multiple  0   Live Births  3          Past Medical History:  Diagnosis Date  . History of gestational hypertension   . Medical history non-contributory   . Postpartum care following vaginal delivery (12/28) 05/09/2016  . Postpartum edema 10/20/2014  . Vaginal discharge during pregnancy in second trimester 08/06/2014   Past Surgical History:  Procedure Laterality Date  . NO PAST SURGERIES    . WISDOM TOOTH EXTRACTION     Family History: family history includes Cancer in her maternal uncle; Diabetes in her maternal aunt; Hypertension in her maternal grandfather. Social History:  reports that she has never smoked. She has never used smokeless tobacco. She reports that she does not drink alcohol or use drugs.     Maternal Diabetes: No Genetic Screening: Normal Maternal Ultrasounds/Referrals: Normal Fetal Ultrasounds or other Referrals:  None Maternal Substance Abuse:  No Significant Maternal Medications:  None Significant Maternal Lab Results:  Lab values include: Group B Strep positive Other Comments:  History of PTD, was on Makena  Review of Systems  Constitutional: Negative.   All other systems reviewed and are negative.  Maternal Medical History:  Fetal activity: Perceived fetal activity is normal.   Last perceived fetal movement was within the past hour.    Prenatal complications: Preterm labor.   Prenatal Complications - Diabetes: none.      Blood pressure 121/73, pulse 81, temperature 98.1 F (36.7 C), temperature source Oral, resp. rate 20, height 5\' 6"  (1.676 m), weight 96 kg (211 lb 9.6 oz), unknown if currently breastfeeding. Maternal Exam:  Uterine Assessment: Contraction strength is mild.  Contraction  frequency is rare.   Abdomen: Patient reports no abdominal tenderness. Fetal presentation: vertex  Introitus: Normal vulva. Normal vagina.  Ferning test: not done.  Nitrazine test: not done. Amniotic fluid character: not assessed.  Pelvis: adequate for delivery.   Cervix: Cervix evaluated by digital exam.     Physical Exam  Nursing note and vitals reviewed. Constitutional: She is oriented to person, place, and time. She appears well-developed and well-nourished.  HENT:  Head: Normocephalic.  Neck: Normal range of motion. Neck supple.  Cardiovascular: Normal rate and regular rhythm.  GI: Soft. Bowel sounds are normal.  Genitourinary: Vagina normal.  Musculoskeletal: Normal range of motion.  Neurological: She is alert and oriented to person, place, and time. She has normal reflexes.  Skin: Skin is warm and dry.  Psychiatric: She has a normal mood and affect.    Prenatal labs: ABO, Rh: A/Negative/-- (12/03 0000) Antibody: Negative (12/03 0000) Rubella: Immune (12/03 0000) RPR: Nonreactive (12/03 0000)  HBsAg: Negative (12/03 0000)  HIV: Non-reactive (12/03 0000)  GBS:     Assessment/Plan: 39 weeks History of PTD GBS pos History of rapid labor IOL, ABX   Jessica Quinn 11/10/2017, 8:16 AM

## 2017-11-10 NOTE — Anesthesia Preprocedure Evaluation (Addendum)
Anesthesia Evaluation  Patient identified by MRN, date of birth, ID band Patient awake    Reviewed: Allergy & Precautions, NPO status , Patient's Chart, lab work & pertinent test results  History of Anesthesia Complications Negative for: history of anesthetic complications  Airway Mallampati: III  TM Distance: >3 FB Neck ROM: Full    Dental  (+) Dental Advisory Given   Pulmonary neg pulmonary ROS,    breath sounds clear to auscultation       Cardiovascular negative cardio ROS   Rhythm:Regular Rate:Normal     Neuro/Psych negative neurological ROS     GI/Hepatic Neg liver ROS, GERD  ,  Endo/Other  obese  Renal/GU negative Renal ROS     Musculoskeletal   Abdominal (+) + obese,   Peds  Hematology Hb 10.2, plt 223k   Anesthesia Other Findings   Reproductive/Obstetrics (+) Pregnancy                            Anesthesia Physical Anesthesia Plan  ASA: II  Anesthesia Plan: Epidural   Post-op Pain Management:    Induction:   PONV Risk Score and Plan: 2 and Treatment may vary due to age or medical condition  Airway Management Planned: Natural Airway  Additional Equipment:   Intra-op Plan:   Post-operative Plan:   Informed Consent: I have reviewed the patients History and Physical, chart, labs and discussed the procedure including the risks, benefits and alternatives for the proposed anesthesia with the patient or authorized representative who has indicated his/her understanding and acceptance.   Dental advisory given  Plan Discussed with:   Anesthesia Plan Comments: (Patient identified. Risks/Benefits/Options discussed with patient including but not limited to bleeding, infection, nerve damage, paralysis, failed block, incomplete pain control, headache, blood pressure changes, nausea, vomiting, reactions to medication both or allergic, itching and postpartum back pain. Confirmed  with bedside nurse the patient's most recent platelet count. Confirmed with patient that they are not currently taking any anticoagulation, have any bleeding history or any family history of bleeding disorders. Patient expressed understanding and wished to proceed. All questions were answered. )       Anesthesia Quick Evaluation

## 2017-11-10 NOTE — Anesthesia Procedure Notes (Signed)
Epidural Patient location during procedure: OB Start time: 11/10/2017 9:00 AM End time: 11/10/2017 9:18 AM  Staffing Anesthesiologist: Jairo BenJackson, Oseas Detty, MD Performed: anesthesiologist   Preanesthetic Checklist Completed: patient identified, surgical consent, pre-op evaluation, timeout performed, IV checked, risks and benefits discussed and monitors and equipment checked  Epidural Patient position: sitting Prep: site prepped and draped and DuraPrep Patient monitoring: blood pressure, continuous pulse ox and heart rate Approach: midline Location: L2-L3 Injection technique: LOR air  Needle:  Needle type: Tuohy  Needle gauge: 17 G Needle length: 9 cm Needle insertion depth: 4.5 cm Catheter type: closed end flexible Catheter size: 19 Gauge Catheter at skin depth: 10 cm Test dose: negative (1% lidocaine)  Assessment Events: blood not aspirated, injection not painful, no injection resistance, negative IV test and no paresthesia  Additional Notes Pt identified in Labor room.  Monitors applied. Working IV access confirmed. Sterile prep, drape lumbar spine.  1% lido local L 2,3.  #17ga Touhy LOR air at 4.5 cm L 2,3, cath in easily to 10 cm skin. Test dose OK, cath dosed and infusion begun.  Patient asymptomatic, VSS, no heme aspirated, tolerated well.  Sandford Craze Shneur [place], MDReason for block:procedure for pain

## 2017-11-10 NOTE — Anesthesia Pain Management Evaluation Note (Signed)
  CRNA Pain Management Visit Note  Patient: Jessica Quinn LippsMarshall Hammersmith, 32 y.o., female  "Hello I am a [facility] the [facility]. We have an anesthesia team available at all times to provide care throughout the hospital, including epidural management and anesthesia for C-section. I don't know your plan for the delivery whether it a natural birth, water birth, IV sedation, nitrous supplementation, doula or epidural, but we want to meet your pain goals."   1.Was your pain managed to your expectations on prior hospitalizations?   Yes   2.What is your expectation for pain management during this hospitalization?     Epidural  3.How can we help you reach that goal? unsure  Record the patient's initial score and the patient's pain goal.   Pain: 10  Pain Goal: 10 [facility] wants you to be able to say your pain was always managed very well.  Cephus ShellingBURGER,Ethanael Veith 11/10/2017

## 2017-11-11 DIAGNOSIS — D509 Iron deficiency anemia, unspecified: Secondary | ICD-10-CM | POA: Diagnosis present

## 2017-11-11 LAB — CBC
HCT: 28.8 % — ABNORMAL LOW (ref 36.0–46.0)
HEMOGLOBIN: 9.3 g/dL — AB (ref 12.0–15.0)
MCH: 25.5 pg — AB (ref 26.0–34.0)
MCHC: 32.3 g/dL (ref 30.0–36.0)
MCV: 79.1 fL (ref 78.0–100.0)
Platelets: 212 10*3/uL (ref 150–400)
RBC: 3.64 MIL/uL — AB (ref 3.87–5.11)
RDW: 15.8 % — ABNORMAL HIGH (ref 11.5–15.5)
WBC: 7.9 10*3/uL (ref 4.0–10.5)

## 2017-11-11 MED ORDER — RHO D IMMUNE GLOBULIN 1500 UNIT/2ML IJ SOSY
300.0000 ug | PREFILLED_SYRINGE | Freq: Once | INTRAMUSCULAR | Status: AC
  Administered 2017-11-11: 300 ug via INTRAVENOUS
  Filled 2017-11-11: qty 2

## 2017-11-11 NOTE — Lactation Note (Signed)
This note was copied from a baby's chart. Lactation Consultation Note  Patient Name: Girl Boris LownCorderia Gowan ZOXWR'UToday's Date: 11/11/2017 Reason for consult: Initial assessment;Other (Comment)(To determine mother's plan seeing she has given a couple of bottles)  P4 mother whose infant is now 5014 hours old.  [facility]; mother had already requested formula  Baby in bassinet as I arrived and mother almost sleeping.  I noticed she had given a couple of bottle feedings and wanted to understand her feeding preference here in the hospital.    Mother stated, "the baby takes the bottle much better."  I discussed this statement with her and why the bottle seemed much easier.  Mother's plans are to breastfeed only while she is in the hospital and then to bottle feed when she arrives home.  I acknowledged her request and suggested she always put baby to breast first before giving any supplementation.  I offered to assist/watch a latch the next time baby shows cues.  I provided the feeding guildelines for amount of supplementation to be given at 14 hours of life.  Mother verbalized understanding.  She will call for assistance as needed.  RN updated.   Maternal Data Formula Feeding for Exclusion: No Has patient been taught Hand Expression?: Yes  Feeding Feeding Type: Bottle Fed - Formula Nipple Type: Slow - flow Length of feed: 25 min  LATCH Score                   Interventions    Lactation Tools Discussed/Used     Consult Status Consult Status: Follow-up Date: 11/12/17 Follow-up type: In-patient    Alvis Pulcini R Yovanna Cogan 11/11/2017, 2:44 AM

## 2017-11-11 NOTE — Progress Notes (Signed)
Mother requested bottle for baby.  RN went over LEAD with parents.  Explained Formula feeding could lead to lower milk supply, engorgement, asthma and allergies and depression in mom.  Mother verbalized understanding and still would like to supplement with formula.  RN gave mother similac 19 cal.  Mother has no further questions at this time.

## 2017-11-11 NOTE — Progress Notes (Signed)
PPD 1 SVD  S:  Reports feeling tired             Tolerating po/ No nausea or vomiting             Bleeding is moderate             Pain controlled with motrin             Up ad lib / ambulatory / voiding QS  Newborn Bottle, Breast and formula / female  O:     VS: BP 103/62 (BP Location: Left Arm)   Pulse 72   Temp 98 F (36.7 C) (Oral)   Resp 18   Ht 5\' 6"  (1.676 m)   Wt 96 kg (211 lb 9.6 oz)   SpO2 99%   Breastfeeding? Unknown   BMI 34.15 kg/m   LABS:             Recent Labs    11/10/17 0810 11/11/17 0526  WBC 6.5 7.9  HGB 10.2* 9.3*  PLT 223 212               Blood type: --/--/A NEG (07/01 0810) / newborn A pos - rhogam ordered  Rubella: Immune (12/03 0000)                        Physical Exam:             Alert and oriented X3  Abdomen: soft, non-tender, non-distended              Fundus: firm, non-tender, Ueven  Perineum: intact  Lochia: light to small   Extremities: no edema, no calf pain or tenderness   A: PPD # 1             IDA of pregnancy - stable   Doing well - stable status  P: Routine post partum orders  Start iron and magnesium             Anticipate DC tomorrow  Marlinda Mikeanya Azie Mcconahy CNM, MSN, [facility] 11/11/2017, 9:02 AM

## 2017-11-12 LAB — RH IG WORKUP (INCLUDES ABO/RH)
ABO/RH(D): A NEG
FETAL SCREEN: NEGATIVE
Gestational Age(Wks): 39
Unit division: 0

## 2017-11-12 MED ORDER — IBUPROFEN 600 MG PO TABS: 600.0000 mg | ORAL_TABLET | Freq: Four times a day (QID) | ORAL | 0 refills | Status: AC

## 2017-11-12 MED ORDER — OXYCODONE-ACETAMINOPHEN 5-325 MG PO TABS: 1.0000 | ORAL_TABLET | ORAL | 0 refills | Status: AC | PRN

## 2017-11-12 NOTE — Discharge Instructions (Signed)
Home Care Instructions for Mom °ACTIVITY °· Gradually return to your regular activities. °· Let yourself rest. Nap while your baby sleeps. °· Avoid lifting anything that is heavier than 10 lb (4.5 kg) until your health care provider says it is okay. °· Avoid activities that take a lot of effort and energy (are strenuous) until approved by your health care provider. Walking at a slow-to-moderate pace is usually safe. °· If you had a cesarean delivery: °? Do not vacuum, climb stairs, or drive a car for 4-6 weeks. °? Have someone help you at home until you feel like you can do your usual activities yourself. °? Do exercises as told by your health care provider, if this applies. ° °VAGINAL BLEEDING °You may continue to bleed for 4-6 weeks after delivery. Over time, the amount of blood usually decreases and the color of the blood usually gets lighter. However, the flow of bright red blood may increase if you have been too active. If you need to use more than one pad in an hour because your pad gets soaked, or if you pass a large clot: °· Lie down. °· Raise your feet. °· Place a cold compress on your lower abdomen. °· Rest. °· Call your health care provider. ° °If you are breastfeeding, your period should return anytime between 8 weeks after delivery and the time that you stop breastfeeding. If you are not breastfeeding, your period should return 6-8 weeks after delivery. °PERINEAL CARE °The perineal area, or perineum, is the part of your body between your thighs. After delivery, this area needs special care. Follow these instructions as told by your health care provider. °· Take warm tub baths for 15-20 minutes. °· Use medicated pads and pain-relieving sprays and creams as told. °· Do not use tampons or douches until vaginal bleeding has stopped. °· Each time you go to the bathroom: °? Use a peri bottle. °? Change your pad. °? Use towelettes in place of toilet paper until your stitches have healed. °· Do Kegel exercises  every day. Kegel exercises help to maintain the muscles that support the vagina, bladder, and bowels. You can do these exercises while you are standing, sitting, or lying down. To do Kegel exercises: °? Tighten the muscles of your abdomen and the muscles that surround your birth canal. °? Hold for a few seconds. °? Relax. °? Repeat until you have done this 5 times in a row. °· To prevent hemorrhoids from developing or getting worse: °? Drink enough fluid to keep your urine clear or pale yellow. °? Avoid straining when having a bowel movement. °? Take over-the-counter medicines and stool softeners as told by your health care provider. ° °BREAST CARE °· Wear a tight-fitting bra. °· Avoid taking over-the-counter pain medicine for breast discomfort. °· Apply ice to the breasts to help with discomfort as needed: °? Put ice in a plastic bag. °? Place a towel between your skin and the bag. °? Leave the ice on for 20 minutes or as told by your health care provider. ° °NUTRITION °· Eat a well-balanced diet. °· Do not try to lose weight quickly by cutting back on calories. °· Take your prenatal vitamins until your postpartum checkup or until your health care provider tells you to stop. ° °POSTPARTUM DEPRESSION °You may find yourself crying for no apparent reason and unable to cope with all of the changes that come with having a newborn. This mood is called postpartum depression. Postpartum depression happens because your hormone   levels change after delivery. If you have postpartum depression, get support from your partner, friends, and family. If the depression does not go away on its own after several weeks, contact your health care provider. BREAST SELF-EXAM Do a breast self-exam each month, at the same time of the month. If you are breastfeeding, check your breasts just after a feeding, when your breasts are less full. If you are breastfeeding and your period has started, check your breasts on day 5, 6, or 7 of your  period. Report any lumps, bumps, or discharge to your health care provider. Know that breasts are normally lumpy if you are breastfeeding. This is temporary, and it is not a health risk. INTIMACY AND SEXUALITY Avoid sexual activity for at least 3-4 weeks after delivery or until the brownish-red vaginal flow is completely gone. If you want to avoid pregnancy, use some form of birth control. You can get pregnant after delivery, even if you have not had your period. SEEK MEDICAL CARE IF:  You feel unable to cope with the changes that a child brings to your life, and these feelings do not go away after several weeks.  You notice a lump, a bump, or discharge on your breast.  SEEK IMMEDIATE MEDICAL CARE IF:  Blood soaks your pad in 1 hour or less.  You have: ? Severe pain or cramping in your lower abdomen. ? A bad-smelling vaginal discharge. ? A fever that is not controlled by medicine. ? A fever, and an area of your breast is red and sore. ? Pain or redness in your calf. ? Sudden, severe chest pain. ? Shortness of breath. ? Painful or bloody urination. ? Problems with your vision.  You vomit for 12 hours or longer.  You develop a severe headache.  You have serious thoughts about hurting yourself, your child, or anyone else.  This information is not intended to replace advice given to you by your health care provider. Make sure you discuss any questions you have with your health care provider. Document Released: 04/26/2000 Document Revised: 10/05/2015 Document Reviewed: 10/31/2014 Elsevier Interactive Patient Education  2017 [employer] Postpartum Depression and Baby Blues The postpartum period begins right after the birth of a baby. During this time, there is often a great amount of joy and excitement. It is also a time of many changes in the life of the parents. Regardless of how many times a mother gives birth, each child brings new challenges and dynamics to the family. It is not  unusual to have feelings of excitement along with confusing shifts in moods, emotions, and thoughts. All mothers are at risk of developing postpartum depression or the "baby blues." These mood changes can occur right after giving birth, or they may occur many months after giving birth. The baby blues or postpartum depression can be mild or severe. Additionally, postpartum depression can go away rather quickly, or it can be a long-term condition. What are the causes? Raised hormone levels and the rapid drop in those levels are thought to be a main cause of postpartum depression and the baby blues. A number of hormones change during and after pregnancy. Estrogen and progesterone usually decrease right after the delivery of your baby. The levels of thyroid hormone and various cortisol steroids also rapidly drop. Other factors that play a role in these mood changes include major life events and genetics. What increases the risk? If you have any of the following risks for the baby blues or postpartum depression, know  what symptoms to watch out for during the postpartum period. Risk factors that may increase the likelihood of getting the baby blues or postpartum depression include:  Having a personal or family history of depression.  Having depression while being pregnant.  Having premenstrual mood issues or mood issues related to oral contraceptives.  Having a lot of life stress.  Having marital conflict.  Lacking a social support network.  Having a baby with special needs.  Having health problems, such as diabetes.  What are the signs or symptoms? Symptoms of baby blues include:  Brief changes in mood, such as going from extreme happiness to sadness.  Decreased concentration.  Difficulty sleeping.  Crying spells, tearfulness.  Irritability.  Anxiety.  Symptoms of postpartum depression typically begin within the first month after giving birth. These symptoms include:  Difficulty  sleeping or excessive sleepiness.  Marked weight loss.  Agitation.  Feelings of worthlessness.  Lack of interest in activity or food.  Postpartum psychosis is a very serious condition and can be dangerous. Fortunately, it is rare. Displaying any of the following symptoms is cause for immediate medical attention. Symptoms of postpartum psychosis include:  Hallucinations and delusions.  Bizarre or disorganized behavior.  Confusion or disorientation.  How is this diagnosed? A diagnosis is made by an evaluation of your symptoms. There are no medical or lab tests that lead to a diagnosis, but there are various questionnaires that a health care provider may use to identify those with the baby blues, postpartum depression, or psychosis. Often, a screening tool called the [place] Postnatal Depression Scale is used to diagnose depression in the postpartum period. How is this treated? The baby blues usually goes away on its own in 1-2 weeks. Social support is often all that is needed. You will be encouraged to get adequate sleep and rest. Occasionally, you may be given medicines to help you sleep. Postpartum depression requires treatment because it can last several months or longer if it is not treated. Treatment may include individual or group therapy, medicine, or both to address any social, physiological, and psychological factors that may play a role in the depression. Regular exercise, a healthy diet, rest, and social support may also be strongly recommended. Postpartum psychosis is more serious and needs treatment right away. Hospitalization is often needed. Follow these instructions at home:  Get as much rest as you can. Nap when the baby sleeps.  Exercise regularly. Some women find yoga and walking to be beneficial.  Eat a balanced and nourishing diet.  Do little things that you enjoy. Have a cup of tea, take a bubble bath, read your favorite magazine, or listen to your favorite  music.  Avoid alcohol.  Ask for help with household chores, cooking, grocery shopping, or running errands as needed. Do not try to do everything.  Talk to people close to you about how you are feeling. Get support from your partner, family members, friends, or other new moms.  Try to stay positive in how you think. Think about the things you are grateful for.  Do not spend a lot of time alone.  Only take over-the-counter or prescription medicine as directed by your health care provider.  Keep all your postpartum appointments.  Let your health care provider know if you have any concerns. Contact a health care provider if: You are having a reaction to or problems with your medicine. Get help right away if:  You have suicidal feelings.  You think you may harm the baby  or someone else. This information is not intended to replace advice given to you by your health care provider. Make sure you discuss any questions you have with your health care provider. Document Released: 02/01/2004 Document Revised: 10/05/2015 Document Reviewed: 02/08/2013 Elsevier Interactive Patient Education  2017 [employer]

## 2017-11-12 NOTE — Discharge Summary (Signed)
Obstetric Discharge Summary  Patient ID: Jessica FurbishCorderia Marshall Quinn MRN: [id number] DOB/AGE: [birth year] 32 y.o.   Date of Admission: 11/10/2017  Date of Discharge:  11/12/17  Admitting Diagnosis: Induction of labor at [id number]  Secondary Diagnosis: History PTD, History rapid labor, GBS positive  Mode of Delivery: normal spontaneous vaginal delivery     Discharge Diagnosis: No other diagnosis   Intrapartum Procedures: Atificial rupture of membranes, epidural and GBS prophylaxis   Post partum procedures: none  Complications: none   Brief Hospital Course   Jessica Quinn is a [id number] who had a SVD on 11/10/2017;  for further details of this birth, please refer to the delivey note.  Patient had an uncomplicated postpartum course.  By time of discharge on PPD#2, her pain was controlled on oral pain medications; she had appropriate lochia and was ambulating, voiding without difficulty and tolerating regular diet.  She was deemed stable for discharge to home.    Labs:  CBC Latest Ref Rng & Units 11/11/2017 11/10/2017 05/10/2016  WBC 4.0 - 10.5 K/uL 7.9 6.5 9.5  Hemoglobin 12.0 - 15.0 g/dL 6.2(X9.3(L) 10.2(L) 8.9(L)  Hematocrit 36.0 - 46.0 % 28.8(L) 31.4(L) 27.3(L)  Platelets 150 - 400 K/uL 212 223 200   A NEG  Physical exam:   Temp:  [97.5 F (36.4 C)-98.7 F (37.1 C)] 97.5 F (36.4 C) (07/03 0539) Pulse Rate:  [56-73] 72 (07/03 0539) Resp:  [18-20] 18 (07/03 0539) BP: (109-121)/(63-79) 109/77 (07/03 0539) SpO2:  [100 %] 100 % (07/02 1402)  General: alert and no distress  Lochia: appropriate  Abdomen: soft, NT  Uterine Fundus: firm  Perineum: healing well, no significant drainage, no dehiscence, no significant erythema  Extremities: No evidence of DVT seen on physical exam. No lower extremity edema.  Discharge Instructions: Per After Visit Summary.  Activity: Advance as tolerated. Pelvic rest for 6 weeks.  Also refer to After Visit Summary  Diet:  Regular  Medications: Allergies as of 11/12/2017      Reactions   Sulfa Antibiotics Rash      Medication List    STOP taking these medications   cyclobenzaprine 10 MG tablet Commonly known as:  FLEXERIL   hydroxyprogesterone caproate 250 mg/mL Oil injection Commonly known as:  MAKENA     TAKE these medications   ibuprofen 600 MG tablet Commonly known as:  ADVIL,MOTRIN Take 1 tablet (600 mg total) by mouth every 6 (six) hours.   oxyCODONE-acetaminophen 5-325 MG tablet Commonly known as:  PERCOCET/ROXICET Take 1 tablet by mouth every 4 (four) hours as needed (pain scale 4-7).   prenatal multivitamin Tabs tablet Take 1 tablet by mouth daily.      Outpatient follow up:  Follow-up Information    Olivia Mackieaavon, Richard, MD Follow up in 6 week(s).   Specialty:  Obstetrics and Gynecology Why:  Office will contact you to schedule six (6) week PPV  Contact information: [address] [place] [address] [phone]           Discharged Condition: stable  Discharged to: home   Newborn Data:  Disposition:home with mother  Apgars: APGAR (1 MIN): 9   APGAR (5 MINS): 10    Baby Feeding: Bottle and Breast   Gunnar BullaJenkins Michelle Mariabella Nilsen, CNM [facility] 11/12/17 11:02 AM

## 2018-06-27 ENCOUNTER — Encounter (HOSPITAL_COMMUNITY): Payer: Self-pay | Admitting: *Deleted

## 2018-06-27 ENCOUNTER — Emergency Department (HOSPITAL_COMMUNITY): Payer: 59

## 2018-06-27 ENCOUNTER — Emergency Department (HOSPITAL_COMMUNITY)
Admission: EM | Admit: 2018-06-27 | Discharge: 2018-06-27 | Disposition: A | Discharge status: Home / Self Care | Payer: 59 | Attending: Emergency Medicine | Admitting: Emergency Medicine

## 2018-06-27 DIAGNOSIS — M79601 Pain in right arm: Secondary | ICD-10-CM | POA: Diagnosis not present

## 2018-06-27 DIAGNOSIS — Z79899 Other long term (current) drug therapy: Secondary | ICD-10-CM | POA: Diagnosis not present

## 2018-06-27 DIAGNOSIS — W19XXXA Unspecified fall, initial encounter: Secondary | ICD-10-CM

## 2018-06-27 MED ORDER — MORPHINE SULFATE (PF) 4 MG/ML IV SOLN
4.0000 mg | Freq: Once | INTRAVENOUS | Status: AC
  Administered 2018-06-27: 4 mg via INTRAMUSCULAR
  Filled 2018-06-27: qty 1

## 2018-06-27 NOTE — Discharge Instructions (Signed)

## 2018-06-27 NOTE — ED Provider Notes (Signed)
[facility] EMERGENCY DEPARTMENT Provider Note   CSN: [id number] Arrival date & time: 06/27/18  1409     History   Chief Complaint Chief Complaint  Patient presents with  . Fall    HPI Cerra Kensington Luckenbach is a 33 y.o. female.  HPI   Patient is a 33 year old female who presents the emergency department today for evaluation after a mechanical fall.  Patient states that she became frightened by another dog while she was walking, she tried to run away, tripped and fell onto her right upper extremity.  She denies head trauma or LOC.  She is complaining of pain to the right upper extremity.  Pain is constant and severe.  It is not resolved after Tylenol or hydrocodone that she took prior to arrival.  She states she was seen in urgent care prior to arrival and had x-rays completed but was told that the x-rays were inconclusive and was advised to follow-up for further evaluation.  She was given a sling for comfort which she states is somewhat improving symptoms.  Past Medical History:  Diagnosis Date  . History of gestational hypertension   . Medical history non-contributory   . Postpartum care following vaginal delivery (12/28) 05/09/2016  . Postpartum edema 10/20/2014  . Vaginal discharge during pregnancy in second trimester 08/06/2014    Patient Active Problem List   Diagnosis Date Noted  . Postpartum care following vaginal delivery (7/1) 11/11/2017  . IDA (iron deficiency anemia) 11/11/2017  . Encounter for planned induction of labor 11/10/2017  . Hives 04/09/2013    Past Surgical History:  Procedure Laterality Date  . NO PAST SURGERIES    . WISDOM TOOTH EXTRACTION       OB History    Gravida  5   Para  4   Term  3   Preterm  1   AB  1   Living  4     SAB      TAB  1   Ectopic      Multiple  0   Live Births  4            Home Medications    Prior to Admission medications   Medication Sig Start Date End Date Taking?  Authorizing Provider  ibuprofen (ADVIL,MOTRIN) 600 MG tablet Take 1 tablet (600 mg total) by mouth every 6 (six) hours. 11/12/17   Lawhorn, Vanessa [place], CNM  oxyCODONE-acetaminophen (PERCOCET/ROXICET) 5-325 MG tablet Take 1 tablet by mouth every 4 (four) hours as needed (pain scale 4-7). 11/12/17   Lawhorn, Vanessa [place], CNM  Prenatal Vit-Fe Fumarate-FA (PRENATAL MULTIVITAMIN) TABS tablet Take 1 tablet by mouth daily.    [provider]    Family History Family History  Problem Relation Age of Onset  . Diabetes Maternal Aunt   . Cancer Maternal Uncle   . Hypertension Maternal Grandfather     Social History Social History   Tobacco Use  . Smoking status: Never Smoker  . Smokeless tobacco: Never Used  Substance Use Topics  . Alcohol use: No  . Drug use: No     Allergies   Sulfa antibiotics   Review of Systems Review of Systems  Musculoskeletal:       RUE pain  Skin: Negative for wound.  Neurological:       No head trauma or loc     Physical Exam Updated Vital Signs BP (!) 142/89 (BP Location: Right Arm)   Pulse 89   Temp 98  F (36.7 C) (Oral)   Resp 20   LMP 06/22/2018 (Approximate)   SpO2 100%   Physical Exam Vitals signs and nursing note reviewed.  Constitutional:      General: She is not in acute distress.    Appearance: She is well-developed.  HENT:     Head: Normocephalic and atraumatic.  Eyes:     Conjunctiva/sclera: Conjunctivae normal.  Neck:     Musculoskeletal: Neck supple.  Cardiovascular:     Rate and Rhythm: Normal rate.  Pulmonary:     Effort: Pulmonary effort is normal.  Musculoskeletal: Normal range of motion.     Comments: TTP to the right distal humerus, medial/lateral epicondyle, and to the mid ulna. Swelling noted to to the forearm. Grip strength decreased on the right secondary to pain. No TTP to the wrist or hand. Distal pulses intact. NVI. Brisk cap refill.  Skin:    General: Skin is warm and dry.  Neurological:       Mental Status: She is alert.     ED Treatments / Results  Labs (all labs ordered are listed, but only abnormal results are displayed) Labs Reviewed - No data to display  EKG None  Radiology Dg Elbow Complete Right (3+view)  Result Date: 06/27/2018 CLINICAL DATA:  33 year old who fell while running away from a dog this morning, injuring the RIGHT arm. Pain localizing to the mid UPPER arm, POSTERIOR elbow and mid forearm. Initial encounter. EXAM: RIGHT ELBOW - COMPLETE 3+ VIEW COMPARISON:  None. FINDINGS: No evidence of acute fracture or dislocation. Well-preserved joint spaces. Well-preserved bone mineral density. No intrinsic osseous abnormality. No visible POSTERIOR fat pad to confirm a joint effusion or hemarthrosis. IMPRESSION: Normal examination. Electronically Signed   By: Hulan Saashomas  Lawrence M.D.   On: 06/27/2018 16:43   Dg Forearm Right  Result Date: 06/27/2018 CLINICAL DATA:  [age] year old who fell while running away from a dog this morning, injuring the RIGHT arm. Pain localizing to the mid UPPER arm, POSTERIOR elbow and mid forearm. Initial encounter. EXAM: RIGHT FOREARM - 2 VIEW COMPARISON:  None. FINDINGS: No evidence of acute fracture involving the radius or ulna. Well-preserved bone mineral density. No intrinsic osseous abnormalities. Wrist joint intact. IMPRESSION: Normal examination. Electronically Signed   By: Hulan Saashomas  Lawrence M.D.   On: 06/27/2018 16:45   Dg Humerus Right  Result Date: 06/27/2018 CLINICAL DATA:  33 year old who fell while running away from a dog this morning, injuring the RIGHT arm. Pain localizing to the mid UPPER arm, POSTERIOR elbow and mid forearm. Initial encounter. EXAM: RIGHT HUMERUS - 2+ VIEW COMPARISON:  None. FINDINGS: No fractures identified involving the humerus. Well-preserved bone mineral density. No intrinsic osseous abnormalities. Shoulder joint intact. IMPRESSION: Normal examination. Electronically Signed   By: Hulan Saashomas  Lawrence M.D.   On:  06/27/2018 16:41    Procedures Procedures (including critical care time)  Medications Ordered in ED Medications  morphine 4 MG/ML injection 4 mg (4 mg Intramuscular Given 06/27/18 1512)     Initial Impression / Assessment and Plan / ED Course  I have reviewed the triage vital signs and the nursing notes.  Pertinent labs & imaging results that were available during my care of the patient were reviewed by me and considered in my medical decision making (see chart for details).     Final Clinical Impressions(s) / ED Diagnoses   Final diagnoses:  Right arm pain  Fall, initial encounter   Presented the ED today after mechanical fall prior to arrival injuring  her right upper extremity.  She was seen at urgent care and had inconclusive x-rays was therefore sent here.  X-ray of the humerus, elbow and forearm do not show any acute bony abnormality.  Patient does feel current more comfortable in the sling that she was given to by urgent care, therefore recommended that she continue using this but proceed to activity as tolerated when her symptoms improve.  Advised rotating Tylenol and Motrin for pain.  Rice protocol indicated.  Will give Ortho referral for persistent symptoms.  Advised patient to follow-up with them and to return to the ER for new or worsening symptoms.  She voiced understanding of the plan and reasons return.  All questions answered.  Patient stable for discharge.  ED Discharge Orders    None       Rayne Du 06/27/18 1722    Rolan Bucco, MD 06/27/18 1723

## 2018-06-27 NOTE — ED Triage Notes (Signed)
Pt in stating she tripped while walking her dog and landed on her right arm, went to an urgent care but they were unable to determine if it was broken so she was sent here

## 2018-06-27 NOTE — ED Notes (Signed)
Patient verbalizes understanding of discharge instructions. Opportunity for questioning and answers were provided. Pt discharged from ED. 

## 2020-04-11 IMAGING — DX DG ELBOW COMPLETE 3+V*R*
3 series · 3 of 3 positions shown · non-contrast
Comparison: None.

CLINICAL DATA: 32-year-old who fell while running away from a dog
this morning, injuring the RIGHT arm. Pain localizing to the mid
UPPER arm, POSTERIOR elbow and mid forearm. Initial encounter.

EXAM:
RIGHT ELBOW - COMPLETE 3+ VIEW

[x elbow obl right (1 of 2)]
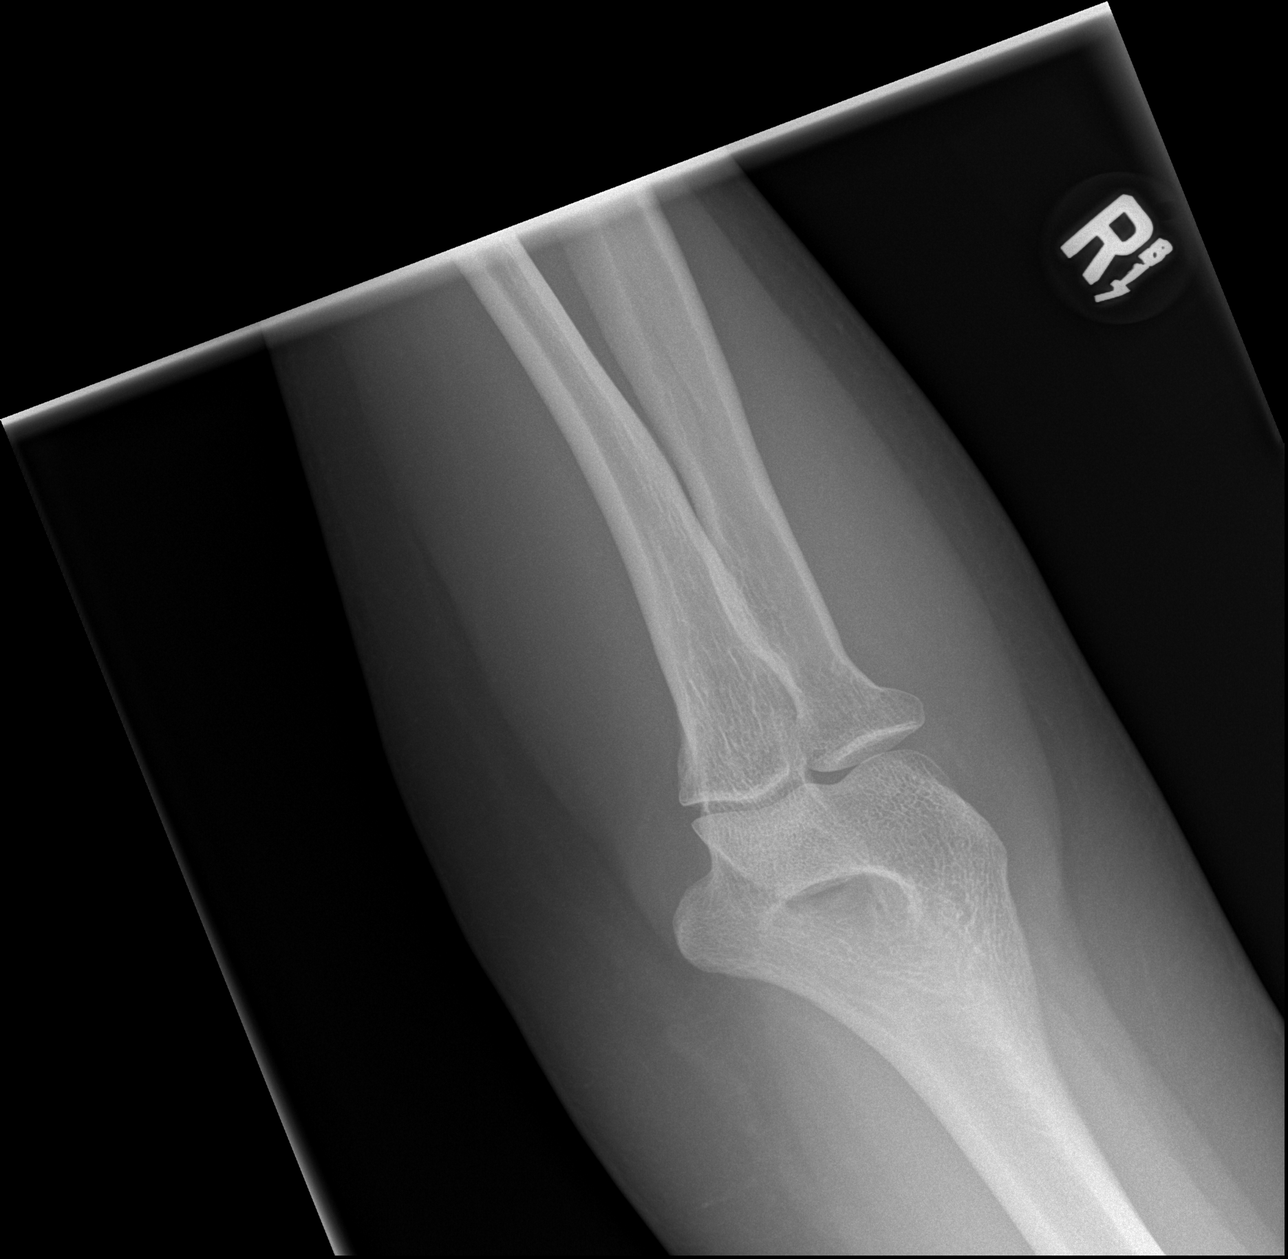

[x elbow obl right (2 of 2)]
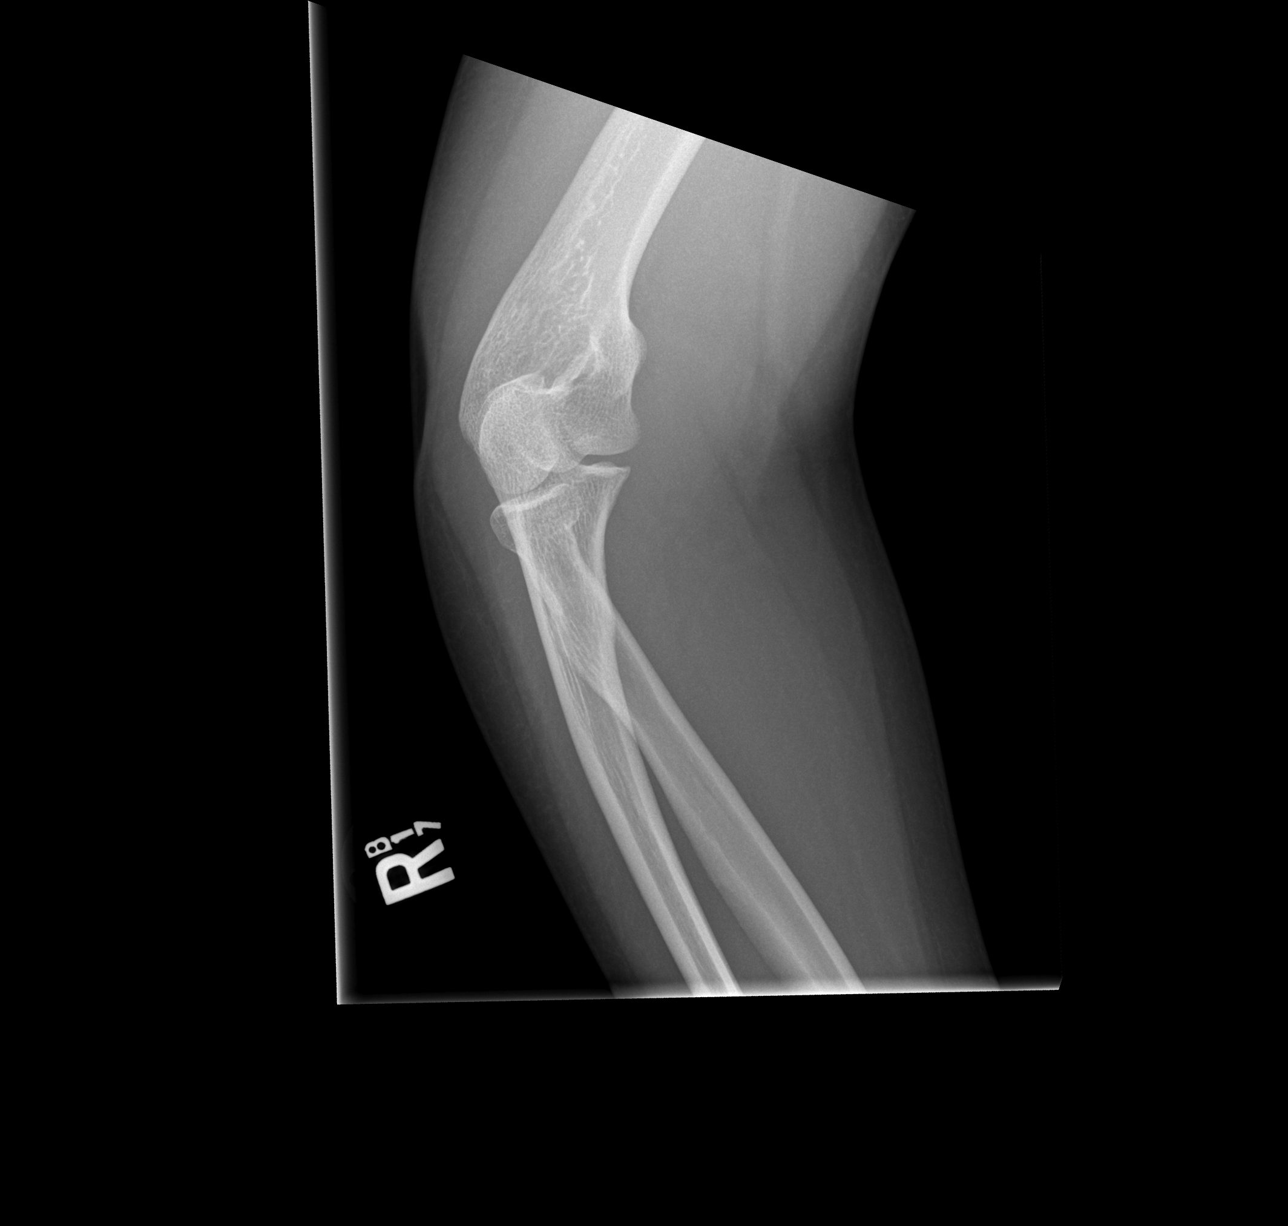

[x elbow lat right]
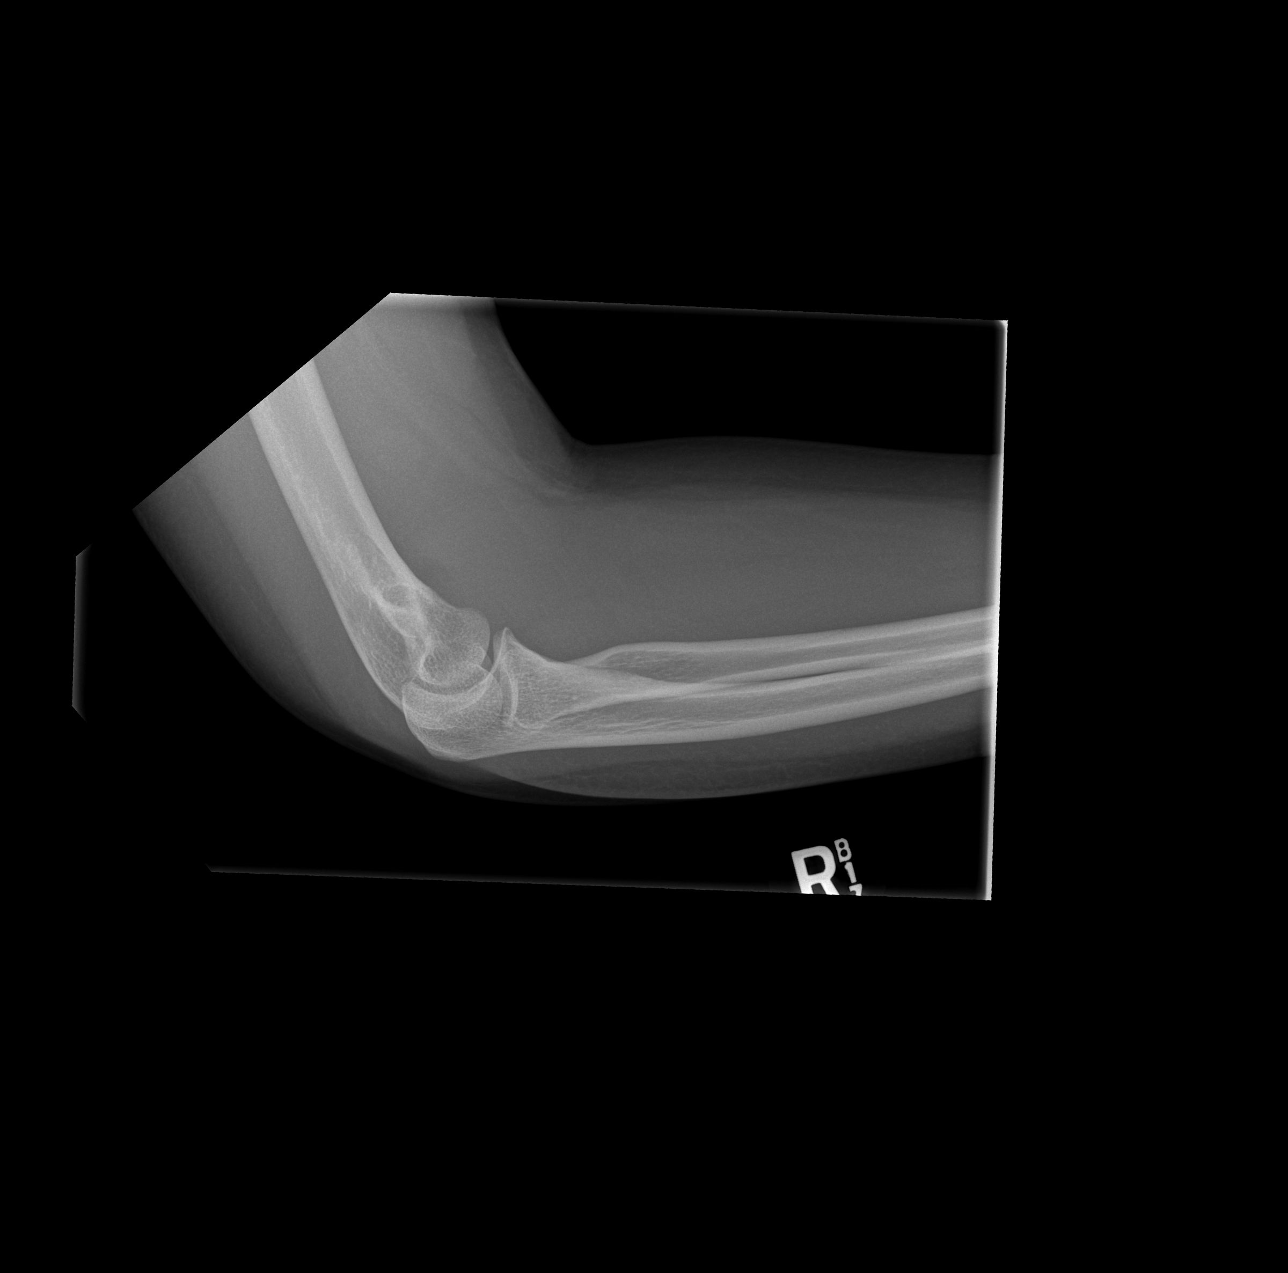

[3 of 3 positions shown; findings below may reference images not displayed]

FINDINGS: No evidence of acute fracture or dislocation. Well-preserved joint
spaces. Well-preserved bone mineral density. No intrinsic osseous
abnormality. No visible POSTERIOR fat pad to confirm a joint
effusion or hemarthrosis.
IMPRESSION: Normal examination.
